# Patient Record
Sex: Male | Born: 1950
Health system: Southern US, Community
[De-identification: ages and names within clinical notes are randomized; demographics above are authoritative.]

## PROBLEM LIST (undated history)

## (undated) DIAGNOSIS — M419 Scoliosis, unspecified: Secondary | ICD-10-CM

## (undated) DIAGNOSIS — T7840XA Allergy, unspecified, initial encounter: Secondary | ICD-10-CM

## (undated) DIAGNOSIS — C801 Malignant (primary) neoplasm, unspecified: Secondary | ICD-10-CM

## (undated) HISTORY — DX: Malignant (primary) neoplasm, unspecified: C80.1

## (undated) HISTORY — DX: Allergy, unspecified, initial encounter: T78.40XA

## (undated) HISTORY — DX: Scoliosis, unspecified: M41.9

---

## 2001-07-30 ENCOUNTER — Encounter: Admission: RE | Admit: 2001-07-30 | Discharge: 2001-07-30 | Payer: Self-pay | Admitting: Family Medicine

## 2001-07-30 ENCOUNTER — Encounter: Payer: Self-pay | Admitting: Family Medicine

## 2002-08-13 HISTORY — PX: MOHS SURGERY: SUR867

## 2004-08-15 ENCOUNTER — Ambulatory Visit: Payer: Self-pay | Admitting: Family Medicine

## 2004-08-25 ENCOUNTER — Ambulatory Visit: Payer: Self-pay | Admitting: Family Medicine

## 2004-09-18 ENCOUNTER — Ambulatory Visit: Payer: Self-pay | Admitting: Family Medicine

## 2004-09-29 ENCOUNTER — Ambulatory Visit: Payer: Self-pay | Admitting: Internal Medicine

## 2004-10-11 ENCOUNTER — Ambulatory Visit: Payer: Self-pay | Admitting: Internal Medicine

## 2005-05-17 ENCOUNTER — Ambulatory Visit: Payer: Self-pay | Admitting: Family Medicine

## 2007-01-13 ENCOUNTER — Ambulatory Visit: Payer: Self-pay | Admitting: Family Medicine

## 2007-03-17 ENCOUNTER — Ambulatory Visit: Payer: Self-pay | Admitting: Family Medicine

## 2007-03-17 LAB — CONVERTED CEMR LAB
ALT: 17 units/L (ref 0–53)
AST: 21 units/L (ref 0–37)
Albumin: 3.7 g/dL (ref 3.5–5.2)
Alkaline Phosphatase: 34 units/L — ABNORMAL LOW (ref 39–117)
BUN: 16 mg/dL (ref 6–23)
Basophils Absolute: 0 10*3/uL (ref 0.0–0.1)
Basophils Relative: 0.5 % (ref 0.0–1.0)
Bilirubin Urine: NEGATIVE
Bilirubin, Direct: 0.1 mg/dL (ref 0.0–0.3)
Blood in Urine, dipstick: NEGATIVE
CO2: 30 meq/L (ref 19–32)
Calcium: 8.6 mg/dL (ref 8.4–10.5)
Chloride: 110 meq/L (ref 96–112)
Cholesterol: 178 mg/dL (ref 0–200)
Creatinine, Ser: 1.1 mg/dL (ref 0.4–1.5)
Eosinophils Absolute: 0.2 10*3/uL (ref 0.0–0.6)
Eosinophils Relative: 3.5 % (ref 0.0–5.0)
GFR calc Af Amer: 89 mL/min
GFR calc non Af Amer: 74 mL/min
Glucose, Bld: 98 mg/dL (ref 70–99)
Glucose, Urine, Semiquant: NEGATIVE
HCT: 42.4 % (ref 39.0–52.0)
HDL: 59 mg/dL (ref >39.0)
Hemoglobin: 14.4 g/dL (ref 13.0–17.0)
Ketones, urine, test strip: NEGATIVE
LDL Cholesterol: 107 mg/dL — ABNORMAL HIGH (ref 0–99)
Lymphocytes Relative: 25.1 % (ref 12.0–46.0)
MCHC: 34.1 g/dL (ref 30.0–36.0)
MCV: 92.8 fL (ref 78.0–100.0)
Monocytes Absolute: 0.8 10*3/uL — ABNORMAL HIGH (ref 0.2–0.7)
Monocytes Relative: 14.4 % — ABNORMAL HIGH (ref 3.0–11.0)
Neutro Abs: 3.1 10*3/uL (ref 1.4–7.7)
Neutrophils Relative %: 56.5 % (ref 43.0–77.0)
Nitrite: NEGATIVE
PSA: 0.73 ng/mL (ref 0.10–4.00)
Platelets: 197 10*3/uL (ref 150–400)
Potassium: 4.8 meq/L (ref 3.5–5.1)
Protein, U semiquant: NEGATIVE
RBC: 4.56 M/uL (ref 4.22–5.81)
RDW: 11.9 % (ref 11.5–14.6)
Sodium: 144 meq/L (ref 135–145)
Specific Gravity, Urine: 1.025
TSH: 0.92 microintl units/mL (ref 0.35–5.50)
Total Bilirubin: 1.2 mg/dL (ref 0.3–1.2)
Total CHOL/HDL Ratio: 3
Total Protein: 6 g/dL (ref 6.0–8.3)
Triglycerides: 59 mg/dL (ref 0–149)
Urobilinogen, UA: 0.2
VLDL: 12 mg/dL (ref 0–40)
WBC Urine, dipstick: NEGATIVE
WBC: 5.5 10*3/uL (ref 4.5–10.5)
pH: 6.5

## 2007-03-24 ENCOUNTER — Ambulatory Visit: Payer: Self-pay | Admitting: Family Medicine

## 2007-03-25 ENCOUNTER — Encounter: Payer: Self-pay | Admitting: Family Medicine

## 2007-08-25 ENCOUNTER — Ambulatory Visit: Payer: Self-pay | Admitting: Family Medicine

## 2007-08-25 DIAGNOSIS — J069 Acute upper respiratory infection, unspecified: Secondary | ICD-10-CM | POA: Insufficient documentation

## 2008-04-16 ENCOUNTER — Ambulatory Visit: Payer: Self-pay | Admitting: Family Medicine

## 2008-04-16 DIAGNOSIS — J309 Allergic rhinitis, unspecified: Secondary | ICD-10-CM | POA: Insufficient documentation

## 2009-07-05 ENCOUNTER — Ambulatory Visit: Payer: Self-pay | Admitting: Family Medicine

## 2009-07-05 LAB — CONVERTED CEMR LAB
ALT: 22 units/L (ref 0–53)
AST: 25 units/L (ref 0–37)
Albumin: 4.1 g/dL (ref 3.5–5.2)
Alkaline Phosphatase: 33 units/L — ABNORMAL LOW (ref 39–117)
BUN: 11 mg/dL (ref 6–23)
Basophils Absolute: 0 10*3/uL (ref 0.0–0.1)
Basophils Relative: 0.6 % (ref 0.0–3.0)
Bilirubin Urine: NEGATIVE
Bilirubin, Direct: 0.1 mg/dL (ref 0.0–0.3)
Blood in Urine, dipstick: NEGATIVE
CO2: 30 meq/L (ref 19–32)
Calcium: 9.2 mg/dL (ref 8.4–10.5)
Chloride: 106 meq/L (ref 96–112)
Cholesterol: 189 mg/dL (ref 0–200)
Creatinine, Ser: 1.2 mg/dL (ref 0.4–1.5)
Eosinophils Absolute: 0.2 10*3/uL (ref 0.0–0.7)
Eosinophils Relative: 3.5 % (ref 0.0–5.0)
GFR calc non Af Amer: 66.05 mL/min (ref >60)
Glucose, Bld: 94 mg/dL (ref 70–99)
Glucose, Urine, Semiquant: NEGATIVE
HCT: 43.9 % (ref 39.0–52.0)
HDL: 60.3 mg/dL (ref >39.00)
Hemoglobin: 15.1 g/dL (ref 13.0–17.0)
Ketones, urine, test strip: NEGATIVE
LDL Cholesterol: 117 mg/dL — ABNORMAL HIGH (ref 0–99)
Lymphocytes Relative: 25.4 % (ref 12.0–46.0)
Lymphs Abs: 1.5 10*3/uL (ref 0.7–4.0)
MCHC: 34.3 g/dL (ref 30.0–36.0)
MCV: 93.9 fL (ref 78.0–100.0)
Monocytes Absolute: 0.7 10*3/uL (ref 0.1–1.0)
Monocytes Relative: 12.6 % — ABNORMAL HIGH (ref 3.0–12.0)
Neutro Abs: 3.4 10*3/uL (ref 1.4–7.7)
Neutrophils Relative %: 57.9 % (ref 43.0–77.0)
Nitrite: NEGATIVE
PSA: 0.71 ng/mL (ref 0.10–4.00)
Platelets: 179 10*3/uL (ref 150.0–400.0)
Potassium: 4.8 meq/L (ref 3.5–5.1)
Protein, U semiquant: NEGATIVE
RBC: 4.68 M/uL (ref 4.22–5.81)
RDW: 12 % (ref 11.5–14.6)
Sodium: 142 meq/L (ref 135–145)
Specific Gravity, Urine: 1.02
TSH: 0.76 microintl units/mL (ref 0.35–5.50)
Total Bilirubin: 1.3 mg/dL — ABNORMAL HIGH (ref 0.3–1.2)
Total CHOL/HDL Ratio: 3
Total Protein: 6.6 g/dL (ref 6.0–8.3)
Triglycerides: 59 mg/dL (ref 0.0–149.0)
Urobilinogen, UA: 0.2
VLDL: 11.8 mg/dL (ref 0.0–40.0)
WBC Urine, dipstick: NEGATIVE
WBC: 5.8 10*3/uL (ref 4.5–10.5)
pH: 7

## 2009-07-14 ENCOUNTER — Ambulatory Visit: Payer: Self-pay | Admitting: Family Medicine

## 2009-09-22 ENCOUNTER — Ambulatory Visit: Payer: Self-pay | Admitting: Family Medicine

## 2010-05-09 DIAGNOSIS — H7290 Unspecified perforation of tympanic membrane, unspecified ear: Secondary | ICD-10-CM | POA: Insufficient documentation

## 2010-05-18 ENCOUNTER — Ambulatory Visit: Payer: Self-pay | Admitting: Family Medicine

## 2010-06-22 ENCOUNTER — Ambulatory Visit: Payer: Self-pay | Admitting: Family Medicine

## 2010-09-12 NOTE — Assessment & Plan Note (Signed)
Summary: COUGH/CHEST CONGESTION/CJR   Vital Signs:  Patient profile:   60 year old male Weight:      184 pounds Temp:     98.6 degrees F oral BP sitting:   130 / 98  (left arm) Cuff size:   regular  Vitals Entered By: Kern Reap CMA Duncan Dull) (September 22, 2009 12:45 PM)  Reason for Visit cough, congestion, sore throat  History of Present Illness: Kenneth York is a 60 year old, married man nonsmoker, who comes in today with a 11 day history of congestion, sore throat, and cough.  He said the fever, earache, sputum production, wheezing etc.  12 point review of systems negative  Allergies: No Known Drug Allergies  Past History:  Past medical, surgical, family and social histories (including risk factors) reviewed for relevance to current acute and chronic problems.  Past Medical History: Reviewed history from 04/16/2008 and no changes required. Allergic rhinitis  Family History: Reviewed history and no changes required.  Social History: Reviewed history from 04/16/2008 and no changes required. Occupation: Married Never Smoked Alcohol use-no Drug use-no Regular exercise-yes  Review of Systems      See HPI  Physical Exam  General:  Well-developed,well-nourished,in no acute distress; alert,appropriate and cooperative throughout examination Head:  Normocephalic and atraumatic without obvious abnormalities. No apparent alopecia or balding. Eyes:  No corneal or conjunctival inflammation noted. EOMI. Perrla. Funduscopic exam benign, without hemorrhages, exudates or papilledema. Vision grossly normal. Ears:  External ear exam shows no significant lesions or deformities.  Otoscopic examination reveals clear canals, tympanic membranes are intact bilaterally without bulging, retraction, inflammation or discharge. Hearing is grossly normal bilaterally. Nose:  External nasal examination shows no deformity or inflammation. Nasal mucosa are pink and moist without lesions or  exudates. Mouth:  Oral mucosa and oropharynx without lesions or exudates.  Teeth in good repair. Neck:  No deformities, masses, or tenderness noted. Chest Wall:  No deformities, masses, tenderness or gynecomastia noted. Lungs:  Normal respiratory effort, chest expands symmetrically. Lungs are clear to auscultation, no crackles or wheezes.   Problems:  Medical Problems Added: 1)  Dx of Viral Infection-unspec  (ICD-079.99)  Impression & Recommendations:  Problem # 1:  VIRAL INFECTION-UNSPEC (ICD-079.99) Assessment New  His updated medication list for this problem includes:    Hydromet 5-1.5 Mg/35ml Syrp (Hydrocodone-homatropine) .Marland Kitchen... 1 or 2 tsps at bedtime as needed  Complete Medication List: 1)  Flonase 50 Mcg/act Susp (Fluticasone propionate) .... Uad 2)  Hydromet 5-1.5 Mg/31ml Syrp (Hydrocodone-homatropine) .Marland Kitchen.. 1 or 2 tsps at bedtime as needed  Patient Instructions: 1)  drank 30 ounces of water daily, vaporizer or humidifier in y  bedroom at night, one or 2 teaspoons of Hydromet nightly, p.r.n. cough, cold, return p.r.n. Prescriptions: HYDROMET 5-1.5 MG/5ML SYRP (HYDROCODONE-HOMATROPINE) 1 or 2 tsps at bedtime as needed  #8oz x 1   Entered and Authorized by:   Roderick Pee MD   Signed by:   Roderick Pee MD on 09/22/2009   Method used:   Print then Give to Patient   RxID:   [id number]

## 2010-09-12 NOTE — Assessment & Plan Note (Signed)
Summary: F/U R EAR DISCOMFORT / PAIN // RS   Vital Signs:  Patient profile:   60 year old male Height:      70.75 inches Weight:      183 pounds BMI:     25.80 Temp:     98.2 degrees F oral BP sitting:   120 / 84  (left arm) Cuff size:   regular  Vitals Entered By: Kern Reap CMA Duncan Dull) (May 18, 2010 12:53 PM) CC: follow-up visit for ear infection   CC:  follow-up visit for ear infection.  History of Present Illness: Kenneth York is a 60 year old, married male, nonsmoker, who comes in today for evaluation of the sudden onset of pain and bleeding in his right ear canal about 12 days ago.  Past 12 days ago.  He was the beach.  It was a Tuesday night September, the 27th, and he woke up at two o'clock in the morning with severe pain in his right ear.  He did notice fluid and blood draining from his ear.  He went to a local urgent care and was told he has tremors.  There and was given Cipro otic drops.  He finished the drops and was told to get follow-up.  He states he is pain-free and has no vertigo no hearing loss.  These never had a problem like this before  Allergies: No Known Drug Allergies  Past History:  Past medical, surgical, family and social histories (including risk factors) reviewed for relevance to current acute and chronic problems.  Past Medical History: Reviewed history from 04/16/2008 and no changes required. Allergic rhinitis  Family History: Reviewed history and no changes required.  Social History: Reviewed history from 04/16/2008 and no changes required. Occupation: Married Never Smoked Alcohol use-no Drug use-no Regular exercise-yes  Review of Systems      See HPI       Flu Vaccine Consent Questions     Do you have a history of severe allergic reactions to this vaccine? no    Any prior history of allergic reactions to egg and/or gelatin? no    Do you have a sensitivity to the preservative Thimersol? no    Do you have a past history of  Guillan-Barre Syndrome? no    Do you currently have an acute febrile illness? no    Have you ever had a severe reaction to latex? no    Vaccine information given and explained to patient? yes    Are you currently pregnant? no    Lot Number:[id number]   Exp Date:02/10/2011   Site Given  Right  Deltoid IM   Physical Exam  General:  Well-developed,well-nourished,in no acute distress; alert,appropriate and cooperative throughout examination Ears:  left ear canal, and left TM normal right ear canal.  Normal and there is a blood clot, perforation.   Impression & Recommendations:  Problem # 1:  TYMPANIC MEMBRANE PERFORATION, RIGHT EAR (ICD-384.20) Assessment New  Complete Medication List: 1)  Flonase 50 Mcg/act Susp (Fluticasone propionate) .... Uad 2)  Hydromet 5-1.5 Mg/54ml Syrp (Hydrocodone-homatropine) .Marland Kitchen.. 1 or 2 tsps at bedtime as needed  Other Orders: Admin 1st Vaccine (56213) Flu Vaccine 60yrs + (08657)  Patient Instructions: 1)  stop the eardrops return in 6 weeks for follow-up, sooner if any problems

## 2010-09-12 NOTE — Assessment & Plan Note (Signed)
Summary: roa/fup/rcd   Vital Signs:  Patient profile:   60 year old male Weight:      184 pounds Temp:     98.6 degrees F oral BP sitting:   130 / 90  (left arm) Cuff size:   regular  Vitals Entered By: Kern Reap CMA Duncan Dull) (June 22, 2010 11:42 AM) CC: follow-up visit   CC:  follow-up visit.  History of Present Illness: Kenneth York is a 60 year old male, who comes in today for follow-up of a ruptured right tympanic membrane.  He feels well.  No pain no dizziness.  Normal.  Hearing  Allergies: No Known Drug Allergies  Physical Exam  General:  Well-developed,well-nourished,in no acute distress; alert,appropriate and cooperative throughout examination Ears:  the right ear.  It was a normal.  There is a healed tympanic membrane perforation.   Impression & Recommendations:  Problem # 1:  TYMPANIC MEMBRANE PERFORATION, RIGHT EAR (ICD-384.20) Assessment Improved  Complete Medication List: 1)  Flonase 50 Mcg/act Susp (Fluticasone propionate) .... Uad 2)  Hydromet 5-1.5 Mg/30ml Syrp (Hydrocodone-homatropine) .Marland Kitchen.. 1 or 2 tsps at bedtime as needed  Patient Instructions: 1)  Please schedule a follow-up appointment as needed.   Orders Added: 1)  Est. Patient Level III [04540]

## 2011-04-09 ENCOUNTER — Ambulatory Visit (INDEPENDENT_AMBULATORY_CARE_PROVIDER_SITE_OTHER): Payer: BC Managed Care – PPO | Admitting: Family Medicine

## 2011-04-09 ENCOUNTER — Encounter: Payer: Self-pay | Admitting: Family Medicine

## 2011-04-09 VITALS — BP 108/70 | Temp 98.5°F | Ht 72.0 in | Wt 181.0 lb

## 2011-04-09 DIAGNOSIS — R351 Nocturia: Secondary | ICD-10-CM

## 2011-04-09 DIAGNOSIS — J309 Allergic rhinitis, unspecified: Secondary | ICD-10-CM

## 2011-04-09 DIAGNOSIS — R300 Dysuria: Secondary | ICD-10-CM

## 2011-04-09 LAB — POCT URINALYSIS DIPSTICK
Bilirubin, UA: NEGATIVE
Blood, UA: NEGATIVE
Glucose, UA: NEGATIVE
Ketones, UA: NEGATIVE
Leukocytes, UA: NEGATIVE
Nitrite, UA: NEGATIVE
Protein, UA: NEGATIVE
Spec Grav, UA: 1.005
Urobilinogen, UA: 0.2
pH, UA: 7.5

## 2011-04-09 NOTE — Patient Instructions (Signed)
Drink lots of water, and the nocturia should resolve on its own.  Avoid caffeine.  Add 10 mg of plain Zyrtec to the steroid nasal spray at bedtime

## 2011-04-09 NOTE — Progress Notes (Signed)
  Subjective:    Patient ID: Kenneth York, male    DOB: Sep 08, 1950, 60 y.o.   MRN: [id number]  HPI Kenneth York is a 60 year old, married male, nonsmoker, who comes in today for evaluation of two problems.  About 3 weeks ago he began having nocturia x 3.  No symptoms otherwise, infection.  No fever, chills, discharge, et Karie Soda.  He's not had prostatitis in the past.  He feels like his symptoms are improving, but not gone.  He does not drink a lot of caffeine.  He's also having symptoms of allergic rhinitis with head congestion, postnasal drip, and slight cough.  No wheezing.   Review of Systems General in pulmonary and urologic review of systems otherwise negative    Objective:   Physical Exam  Well-developed well-nourished man in no acute distress.  Urinalysis normal      Assessment & Plan:  Nocturia plan reassured drink lots of water.  Return p.r.n.  Allergic rhinitis restart the steroid nasal spray and 10 mg of Zyrtec plane nightly

## 2011-07-26 ENCOUNTER — Ambulatory Visit (INDEPENDENT_AMBULATORY_CARE_PROVIDER_SITE_OTHER): Payer: BC Managed Care – PPO | Admitting: Family Medicine

## 2011-07-26 ENCOUNTER — Telehealth: Payer: Self-pay

## 2011-07-26 DIAGNOSIS — Z2911 Encounter for prophylactic immunotherapy for respiratory syncytial virus (RSV): Secondary | ICD-10-CM

## 2011-07-26 DIAGNOSIS — Z23 Encounter for immunization: Secondary | ICD-10-CM

## 2011-07-26 NOTE — Telephone Encounter (Signed)
Per Dr Tawanna Cooler.  Hydromet 4 oz . Left message on machine for patient

## 2011-07-26 NOTE — Telephone Encounter (Signed)
Pt states he may be getting a sinus infection.  Pt has been usuing saline nasal spray as well as Nasonex.  When pt blows his nose he gets some bleed. Pt denies fever, body aches, chills.  Pt would like a z-pak call in to pharmacy.  Pls advise.

## 2011-11-23 ENCOUNTER — Other Ambulatory Visit (INDEPENDENT_AMBULATORY_CARE_PROVIDER_SITE_OTHER): Payer: BC Managed Care – PPO

## 2011-11-23 DIAGNOSIS — Z0000 Encounter for general adult medical examination without abnormal findings: Secondary | ICD-10-CM

## 2011-11-23 LAB — PSA: PSA: 0.85 ng/mL (ref 0.10–4.00)

## 2011-11-23 LAB — HEPATIC FUNCTION PANEL
AST: 22 U/L (ref 0–37)
Albumin: 4.2 g/dL (ref 3.5–5.2)
Alkaline Phosphatase: 42 U/L (ref 39–117)
Total Protein: 6.7 g/dL (ref 6.0–8.3)

## 2011-11-23 LAB — CBC WITH DIFFERENTIAL/PLATELET
Basophils Absolute: 0 10*3/uL (ref 0.0–0.1)
Basophils Relative: 0.5 % (ref 0.0–3.0)
HCT: 43.7 % (ref 39.0–52.0)
Hemoglobin: 14.8 g/dL (ref 13.0–17.0)
Lymphocytes Relative: 20.7 % (ref 12.0–46.0)
Lymphs Abs: 1.3 10*3/uL (ref 0.7–4.0)
MCHC: 33.8 g/dL (ref 30.0–36.0)
Monocytes Relative: 12 % (ref 3.0–12.0)
Neutro Abs: 4.1 10*3/uL (ref 1.4–7.7)
RBC: 4.7 Mil/uL (ref 4.22–5.81)
RDW: 12.7 % (ref 11.5–14.6)

## 2011-11-23 LAB — POCT URINALYSIS DIPSTICK
Bilirubin, UA: NEGATIVE
Glucose, UA: NEGATIVE
Ketones, UA: NEGATIVE
Leukocytes, UA: NEGATIVE
Nitrite, UA: NEGATIVE

## 2011-11-23 LAB — BASIC METABOLIC PANEL
CO2: 28 mEq/L (ref 19–32)
Calcium: 9.3 mg/dL (ref 8.4–10.5)
Glucose, Bld: 85 mg/dL (ref 70–99)
Potassium: 4.4 mEq/L (ref 3.5–5.1)
Sodium: 140 mEq/L (ref 135–145)

## 2011-11-23 LAB — TSH: TSH: 0.9 u[IU]/mL (ref 0.35–5.50)

## 2011-11-23 LAB — LIPID PANEL: Total CHOL/HDL Ratio: 3

## 2011-11-29 ENCOUNTER — Other Ambulatory Visit: Payer: BC Managed Care – PPO

## 2011-12-06 ENCOUNTER — Encounter: Payer: BC Managed Care – PPO | Admitting: Family Medicine

## 2011-12-20 ENCOUNTER — Ambulatory Visit (INDEPENDENT_AMBULATORY_CARE_PROVIDER_SITE_OTHER): Payer: BC Managed Care – PPO | Admitting: Family Medicine

## 2011-12-20 ENCOUNTER — Encounter: Payer: Self-pay | Admitting: Family Medicine

## 2011-12-20 DIAGNOSIS — M419 Scoliosis, unspecified: Secondary | ICD-10-CM | POA: Insufficient documentation

## 2011-12-20 DIAGNOSIS — Z0000 Encounter for general adult medical examination without abnormal findings: Secondary | ICD-10-CM

## 2011-12-20 DIAGNOSIS — J309 Allergic rhinitis, unspecified: Secondary | ICD-10-CM

## 2011-12-20 DIAGNOSIS — M4120 Other idiopathic scoliosis, site unspecified: Secondary | ICD-10-CM

## 2011-12-20 MED ORDER — FLUTICASONE PROPIONATE 50 MCG/ACT NA SUSP: 2.0000 | Freq: Every day | NASAL | Status: DC

## 2011-12-20 NOTE — Progress Notes (Signed)
  Subjective:    Patient ID: Kenneth York, male    DOB: 01-19-1951, 61 y.o.   MRN: [id number]  HPI Kenneth York is a 61 year old married male nonsmoker who comes in today for general physical examination  He's always been in excellent health he's had no chronic health problems except for some minor allergic rhinitis. He does have some congenital thoracic scoliosis  He gets routine eye care, dental care, initial colonoscopy 8 years ago normal, tetanus 2010, shingles 2012, he exercises on a regular basis he is very fit. He does have some tenderness in his left elbow from lifting weights   Review of Systems  Constitutional: Negative.   HENT: Negative.   Eyes: Negative.   Respiratory: Negative.   Cardiovascular: Negative.   Gastrointestinal: Negative.   Genitourinary: Negative.   Musculoskeletal: Negative.   Skin: Negative.   Neurological: Negative.   Hematological: Negative.   Psychiatric/Behavioral: Negative.        Objective:   Physical Exam  Constitutional: He is oriented to person, place, and time. He appears well-developed and well-nourished.  HENT:  Head: Normocephalic and atraumatic.  Right Ear: External ear normal.  Left Ear: External ear normal.  Nose: Nose normal.  Mouth/Throat: Oropharynx is clear and moist.  Eyes: Conjunctivae and EOM are normal. Pupils are equal, round, and reactive to light.  Neck: Normal range of motion. Neck supple. No JVD present. No tracheal deviation present. No thyromegaly present.  Cardiovascular: Normal rate, regular rhythm, normal heart sounds and intact distal pulses.  Exam reveals no gallop and no friction rub.   No murmur heard.      Thoracic scoliosis  Pulmonary/Chest: Effort normal and breath sounds normal. No stridor. No respiratory distress. He has no wheezes. He has no rales. He exhibits no tenderness.  Abdominal: Soft. Bowel sounds are normal. He exhibits no distension and no mass. There is no tenderness. There is no rebound and  no guarding.  Genitourinary: Rectum normal, prostate normal and penis normal. Guaiac negative stool. No penile tenderness.  Musculoskeletal: Normal range of motion. He exhibits no edema and no tenderness.  Lymphadenopathy:    He has no cervical adenopathy.  Neurological: He is alert and oriented to person, place, and time. He has normal reflexes. No cranial nerve deficit. He exhibits normal muscle tone.  Skin: Skin is warm and dry. No rash noted. No erythema. No pallor.  Psychiatric: He has a normal mood and affect. His behavior is normal. Judgment and thought content normal.          Assessment & Plan:  Healthy male  Allergic rhinitis continue over-the-counter Claritin steroid nasal spray  Thoracic scoliosis asymptomatic  EKG shows some nodal beats asymptomatic patient says he does have an occasional fluttering sensation but short lived and rare and not exercise related reassured

## 2011-12-20 NOTE — Patient Instructions (Signed)
Continue your good health habits return in one year sooner if any problems 

## 2012-10-09 ENCOUNTER — Other Ambulatory Visit (INDEPENDENT_AMBULATORY_CARE_PROVIDER_SITE_OTHER): Payer: BC Managed Care – PPO

## 2012-10-09 DIAGNOSIS — Z0000 Encounter for general adult medical examination without abnormal findings: Secondary | ICD-10-CM

## 2012-10-09 LAB — TSH: TSH: 0.92 u[IU]/mL (ref 0.35–5.50)

## 2012-10-09 LAB — CBC WITH DIFFERENTIAL/PLATELET
Basophils Relative: 0.5 % (ref 0.0–3.0)
Eosinophils Absolute: 0.1 10*3/uL (ref 0.0–0.7)
Eosinophils Relative: 2.4 % (ref 0.0–5.0)
Hemoglobin: 15.2 g/dL (ref 13.0–17.0)
Lymphocytes Relative: 22.1 % (ref 12.0–46.0)
MCHC: 33.8 g/dL (ref 30.0–36.0)
Monocytes Relative: 14.1 % — ABNORMAL HIGH (ref 3.0–12.0)
Neutro Abs: 3.1 10*3/uL (ref 1.4–7.7)
Neutrophils Relative %: 60.9 % (ref 43.0–77.0)
RBC: 4.94 Mil/uL (ref 4.22–5.81)
WBC: 5.1 10*3/uL (ref 4.5–10.5)

## 2012-10-09 LAB — HEPATIC FUNCTION PANEL
AST: 23 U/L (ref 0–37)
Alkaline Phosphatase: 37 U/L — ABNORMAL LOW (ref 39–117)
Bilirubin, Direct: 0.1 mg/dL (ref 0.0–0.3)
Total Bilirubin: 0.9 mg/dL (ref 0.3–1.2)

## 2012-10-09 LAB — POCT URINALYSIS DIPSTICK
Bilirubin, UA: NEGATIVE
Blood, UA: NEGATIVE
Glucose, UA: NEGATIVE
Ketones, UA: NEGATIVE
Nitrite, UA: NEGATIVE
Spec Grav, UA: 1.01

## 2012-10-09 LAB — BASIC METABOLIC PANEL
BUN: 15 mg/dL (ref 6–23)
CO2: 27 mEq/L (ref 19–32)
Chloride: 104 mEq/L (ref 96–112)
Potassium: 5.1 mEq/L (ref 3.5–5.1)

## 2012-10-09 LAB — LIPID PANEL
Cholesterol: 191 mg/dL (ref 0–200)
Triglycerides: 49 mg/dL (ref 0.0–149.0)

## 2012-10-16 ENCOUNTER — Encounter: Payer: BC Managed Care – PPO | Admitting: Family Medicine

## 2012-11-18 ENCOUNTER — Encounter: Payer: BC Managed Care – PPO | Admitting: Family Medicine

## 2012-12-11 ENCOUNTER — Ambulatory Visit (INDEPENDENT_AMBULATORY_CARE_PROVIDER_SITE_OTHER): Payer: BC Managed Care – PPO | Admitting: Family Medicine

## 2012-12-11 ENCOUNTER — Encounter: Payer: Self-pay | Admitting: Family Medicine

## 2012-12-11 VITALS — BP 130/90 | Temp 98.7°F | Wt 178.0 lb

## 2012-12-11 DIAGNOSIS — J309 Allergic rhinitis, unspecified: Secondary | ICD-10-CM

## 2012-12-11 MED ORDER — FLUTICASONE PROPIONATE 50 MCG/ACT NA SUSP: 2.0000 | Freq: Every day | NASAL | Status: DC

## 2012-12-11 NOTE — Progress Notes (Signed)
  Subjective:    Patient ID: BENTON TOOKER, male    DOB: Aug 16, 1950, 62 y.o.   MRN: [id number]  HPI Mr. Korber is a 62 year old male married nonsmoker who comes in today for evaluation of a cough sore throat and congestion for a couple weeks  He has a history of allergic rhinitis however he's not been using any nasal spray no antihistamines. His symptoms started April 14. No fever no facial pain   Review of Systems    review of systems negative Objective:   Physical Exam  Well-developed and nourished male no acute distress HEENT were negative neck was supple no adenopathy lungs are clear he does have thoracic scoliosis with elevation and deformity of his right posterior ribs      Assessment & Plan:  Allergic rhinitis plan treat symptomatically with Zyrtec and steroid nasal spray

## 2012-12-11 NOTE — Patient Instructions (Signed)
Zyrtec 10 mg plain,,,,,,,,,, 1 at bedtime  Steroid nasal spray,,,,,,,,,, 1 shot up each nostril at bedtime

## 2012-12-23 ENCOUNTER — Encounter: Payer: Self-pay | Admitting: Family Medicine

## 2012-12-23 ENCOUNTER — Ambulatory Visit (INDEPENDENT_AMBULATORY_CARE_PROVIDER_SITE_OTHER): Payer: BC Managed Care – PPO | Admitting: Family Medicine

## 2012-12-23 VITALS — BP 110/76 | Temp 98.4°F | Ht 71.25 in | Wt 178.0 lb

## 2012-12-23 DIAGNOSIS — J309 Allergic rhinitis, unspecified: Secondary | ICD-10-CM

## 2012-12-23 DIAGNOSIS — Z0000 Encounter for general adult medical examination without abnormal findings: Secondary | ICD-10-CM

## 2012-12-23 NOTE — Progress Notes (Signed)
  Subjective:    Patient ID: Kenneth York, male    DOB: 1950/12/11, 62 y.o.   MRN: [id number]  HPI Kenneth York is a 62 year old married male nonsmoker who comes in today for general physical examination  He has a history of allergic rhinitis for which he uses OTC Zyrtec and a steroid nasal spray  He gets routine eye care, dental care, colonoscopy in his early 55s normal, tetanus 2010, seasonal flu shot every fall, shingles 2012   Review of Systems  Constitutional: Negative.   HENT: Negative.   Eyes: Negative.   Respiratory: Negative.   Cardiovascular: Negative.   Gastrointestinal: Negative.   Genitourinary: Negative.   Musculoskeletal: Negative.   Skin: Negative.   Neurological: Negative.   Psychiatric/Behavioral: Negative.        Objective:   Physical Exam  Constitutional: He is oriented to person, place, and time. He appears well-developed and well-nourished.  HENT:  Head: Normocephalic and atraumatic.  Right Ear: External ear normal.  Left Ear: External ear normal.  Nose: Nose normal.  Mouth/Throat: Oropharynx is clear and moist.  Eyes: Conjunctivae and EOM are normal. Pupils are equal, round, and reactive to light.  Neck: Normal range of motion. Neck supple. No JVD present. No tracheal deviation present. No thyromegaly present.  Cardiovascular: Normal rate, regular rhythm, normal heart sounds and intact distal pulses.  Exam reveals no gallop and no friction rub.   No murmur heard. Carotids aorta and pulses all normal no bruits  Pulmonary/Chest: Effort normal and breath sounds normal. No stridor. No respiratory distress. He has no wheezes. He has no rales. He exhibits no tenderness.  Thoracic scoliosis  Abdominal: Soft. Bowel sounds are normal. He exhibits no distension and no mass. There is no tenderness. There is no rebound and no guarding.  Genitourinary: Rectum normal, prostate normal and penis normal. Guaiac negative stool. No penile tenderness.  Musculoskeletal:  Normal range of motion. He exhibits no edema and no tenderness.  Lymphadenopathy:    He has no cervical adenopathy.  Neurological: He is alert and oriented to person, place, and time. He has normal reflexes. No cranial nerve deficit. He exhibits normal muscle tone.  Skin: Skin is warm and dry. No rash noted. No erythema. No pallor.  Psychiatric: He has a normal mood and affect. His behavior is normal. Judgment and thought content normal.          Assessment & Plan:  Healthy male  Allergic rhinitis continue Zyrtec and steroid nasal spray

## 2012-12-23 NOTE — Patient Instructions (Signed)
Continue the plain 10 mg of Zyrtec along with a steroid nasal spray  Return in one year for general physical examination sooner if any problems

## 2014-08-19 ENCOUNTER — Encounter: Payer: Self-pay | Admitting: Internal Medicine

## 2014-10-04 ENCOUNTER — Encounter: Payer: Self-pay | Admitting: Gastroenterology

## 2014-10-22 ENCOUNTER — Encounter: Payer: Self-pay | Admitting: Gastroenterology

## 2014-12-17 ENCOUNTER — Encounter: Payer: Self-pay | Admitting: Internal Medicine

## 2014-12-23 ENCOUNTER — Encounter: Payer: Self-pay | Admitting: Gastroenterology

## 2014-12-28 ENCOUNTER — Ambulatory Visit (INDEPENDENT_AMBULATORY_CARE_PROVIDER_SITE_OTHER): Payer: BC Managed Care – PPO | Admitting: Adult Health

## 2014-12-28 ENCOUNTER — Encounter: Payer: Self-pay | Admitting: Adult Health

## 2014-12-28 VITALS — BP 112/86 | HR 68 | Temp 98.6°F | Ht 71.25 in | Wt 177.2 lb

## 2014-12-28 DIAGNOSIS — R05 Cough: Secondary | ICD-10-CM

## 2014-12-28 DIAGNOSIS — J069 Acute upper respiratory infection, unspecified: Secondary | ICD-10-CM

## 2014-12-28 DIAGNOSIS — R059 Cough, unspecified: Secondary | ICD-10-CM

## 2014-12-28 MED ORDER — PREDNISONE 20 MG PO TABS: 20.0000 mg | ORAL_TABLET | Freq: Every day | ORAL | Status: DC

## 2014-12-28 MED ORDER — ALBUTEROL SULFATE 108 (90 BASE) MCG/ACT IN AEPB: 108.0000 ug | INHALATION_SPRAY | Freq: Three times a day (TID) | RESPIRATORY_TRACT | Status: DC | PRN

## 2014-12-28 NOTE — Progress Notes (Signed)
   Subjective:    Patient ID: Kenneth York, male    DOB: [birth year], 64 y.o.   MRN: [id number]  HPI Patient presents to the office to "make sure I don't have pneumonia or lung cancer." He endorses having a cough with occassional production for the last few months. He has tried Zyrtec, once a week. Has not tried anything else. He is going on a trip to [facility] and wants to make sure nothing is going on. He continues to cough and clear his throat.   Endorses that "nothing is stopping me from doing anything, I still work out and enjoy my life."  Denies fever, n/v/d/. No sick contact.   Review of Systems  HENT: Positive for congestion, ear pain, postnasal drip and rhinorrhea. Negative for hearing loss and sinus pressure.   Respiratory: Positive for cough. Negative for chest tightness, shortness of breath and wheezing.   Cardiovascular: Negative for chest pain and palpitations.  Hematological: Negative for adenopathy.  All other systems reviewed and are negative.      Objective:   Physical Exam  Constitutional: He is oriented to person, place, and time. He appears well-developed and well-nourished. No distress.  HENT:  Head: Normocephalic and atraumatic.  Right Ear: External ear normal.  Left Ear: External ear normal.  Nose: Nose normal.  Mouth/Throat: Oropharynx is clear and moist.  Cardiovascular: Normal rate, normal heart sounds and intact distal pulses.   Pulmonary/Chest: Effort normal. No respiratory distress. He exhibits no tenderness.  Rhonchi in right upper   Musculoskeletal: Normal range of motion.  Neurological: He is alert and oriented to person, place, and time.  Skin: Skin is warm and dry. Rash noted. He is not diaphoretic. No erythema. No pallor.  Psychiatric: He has a normal mood and affect. His behavior is normal. Thought content normal.  Nursing note and vitals reviewed.      Assessment & Plan:   1. Acute upper respiratory infection - Likely bronchitis. Due to  time frame, and rhonchi,I suggested ABX and  Chest xray. Patient refused at this time.  - Albuterol Sulfate (PROAIR RESPICLICK) 676 (90 BASE) MCG/ACT AEPB; Inhale 108 mcg into the lungs 3 (three) times daily as needed.  Dispense: 1 each; Refill: 2 - predniSONE (DELTASONE) 20 MG tablet; Take 1 tablet (20 mg total) by mouth daily with breakfast.  Dispense: 9 tablet; Refill: 0 - Follow up if fever develops, he has SOB, or increased sputum production.   2. Cough - predniSONE (DELTASONE) 20 MG tablet; Take 1 tablet (20 mg total) by mouth daily with breakfast.  Dispense: 9 tablet; Refill: 0

## 2014-12-28 NOTE — Patient Instructions (Addendum)
I have sent a prescription to the pharmacy for the inhaler. Use it three times a day as needed. I have also sent a prescription to the pharmacy for a prednisone taper. Please take as directed: Day 1 40 mg Day 2 40 mg Day 3 30 mg Day 4 30 mg Day 5 20 mg Day 6 10 mg Day 7 10 mg  Continue to use Zyrtec and Flonase on a daily basis. You can also incorporate Mucinex into your routine to help with the congestion.   Follow up if you are not feeling better in 2-3 day or when you return from [place]. Follow up sooner if you notice a fever, body aches, shortness of breath or increased sputum production. Upper Respiratory Infection, Adult An upper respiratory infection (URI) is also sometimes known as the common cold. The upper respiratory tract includes the nose, sinuses, throat, trachea, and bronchi. Bronchi are the airways leading to the lungs. Most people improve within 1 week, but symptoms can last up to 2 weeks. A residual cough may last even longer.  CAUSES Many different viruses can infect the tissues lining the upper respiratory tract. The tissues become irritated and inflamed and often become very moist. Mucus production is also common. A cold is contagious. You can easily spread the virus to others by oral contact. This includes kissing, sharing a glass, coughing, or sneezing. Touching your mouth or nose and then touching a surface, which is then touched by another person, can also spread the virus. SYMPTOMS  Symptoms typically develop 1 to 3 days after you come in contact with a cold virus. Symptoms vary from person to person. They may include:  Runny nose.  Sneezing.  Nasal congestion.  Sinus irritation.  Sore throat.  Loss of voice (laryngitis).  Cough.  Fatigue.  Muscle aches.  Loss of appetite.  Headache.  Low-grade fever. DIAGNOSIS  You might diagnose your own cold based on familiar symptoms, since most people get a cold 2 to 3 times a year. Your caregiver can confirm  this based on your exam. Most importantly, your caregiver can check that your symptoms are not due to another disease such as strep throat, sinusitis, pneumonia, asthma, or epiglottitis. Blood tests, throat tests, and X-rays are not necessary to diagnose a common cold, but they may sometimes be helpful in excluding other more serious diseases. Your caregiver will decide if any further tests are required. RISKS AND COMPLICATIONS  You may be at risk for a more severe case of the common cold if you smoke cigarettes, have chronic heart disease (such as heart failure) or lung disease (such as asthma), or if you have a weakened immune system. The very young and very old are also at risk for more serious infections. Bacterial sinusitis, middle ear infections, and bacterial pneumonia can complicate the common cold. The common cold can worsen asthma and chronic obstructive pulmonary disease (COPD). Sometimes, these complications can require emergency medical care and may be life-threatening. PREVENTION  The best way to protect against getting a cold is to practice good hygiene. Avoid oral or hand contact with people with cold symptoms. Wash your hands often if contact occurs. There is no clear evidence that vitamin C, vitamin E, echinacea, or exercise reduces the chance of developing a cold. However, it is always recommended to get plenty of rest and practice good nutrition. TREATMENT  Treatment is directed at relieving symptoms. There is no cure. Antibiotics are not effective, because the infection is caused by a  virus, not by bacteria. Treatment may include:  Increased fluid intake. Sports drinks offer valuable electrolytes, sugars, and fluids.  Breathing heated mist or steam (vaporizer or shower).  Eating chicken soup or other clear broths, and maintaining good nutrition.  Getting plenty of rest.  Using gargles or lozenges for comfort.  Controlling fevers with ibuprofen or acetaminophen as directed by  your caregiver.  Increasing usage of your inhaler if you have asthma. Zinc gel and zinc lozenges, taken in the first 24 hours of the common cold, can shorten the duration and lessen the severity of symptoms. Pain medicines may help with fever, muscle aches, and throat pain. A variety of non-prescription medicines are available to treat congestion and runny nose. Your caregiver can make recommendations and may suggest nasal or lung inhalers for other symptoms.  HOME CARE INSTRUCTIONS   Only take over-the-counter or prescription medicines for pain, discomfort, or fever as directed by your caregiver.  Use a warm mist humidifier or inhale steam from a shower to increase air moisture. This may keep secretions moist and make it easier to breathe.  Drink enough water and fluids to keep your urine clear or pale yellow.  Rest as needed.  Return to work when your temperature has returned to normal or as your caregiver advises. You may need to stay home longer to avoid infecting others. You can also use a face mask and careful hand washing to prevent spread of the virus. SEEK MEDICAL CARE IF:   After the first few days, you feel you are getting worse rather than better.  You need your caregiver's advice about medicines to control symptoms.  You develop chills, worsening shortness of breath, or brown or red sputum. These may be signs of pneumonia.  You develop yellow or brown nasal discharge or pain in the face, especially when you bend forward. These may be signs of sinusitis.  You develop a fever, swollen neck glands, pain with swallowing, or white areas in the back of your throat. These may be signs of strep throat. SEEK IMMEDIATE MEDICAL CARE IF:   You have a fever.  You develop severe or persistent headache, ear pain, sinus pain, or chest pain.  You develop wheezing, a prolonged cough, cough up blood, or have a change in your usual mucus (if you have chronic lung disease).  You develop  sore muscles or a stiff neck. Document Released: 01/23/2001 Document Revised: 10/22/2011 Document Reviewed: 11/04/2013 [facility] Patient Information 2015 [place], [place]. This information is not intended to replace advice given to you by your health care provider. Make sure you discuss any questions you have with your health care provider.

## 2014-12-28 NOTE — Progress Notes (Signed)
Pre visit review using our clinic review tool, if applicable. No additional management support is needed unless otherwise documented below in the visit note. 

## 2015-02-03 ENCOUNTER — Ambulatory Visit (AMBULATORY_SURGERY_CENTER): Payer: Self-pay

## 2015-02-03 VITALS — Ht 72.0 in | Wt 175.8 lb

## 2015-02-03 DIAGNOSIS — Z1211 Encounter for screening for malignant neoplasm of colon: Secondary | ICD-10-CM

## 2015-02-03 NOTE — Progress Notes (Signed)
No allergies to eggs or soy No past problems with anesthesia No diet/weight loss meds No home oxygen  Has email  Emmi instructions given for colonoscopy 

## 2015-02-18 ENCOUNTER — Encounter: Payer: Self-pay | Admitting: Internal Medicine

## 2015-02-18 ENCOUNTER — Ambulatory Visit (AMBULATORY_SURGERY_CENTER): Payer: BC Managed Care – PPO | Admitting: Internal Medicine

## 2015-02-18 VITALS — BP 108/78 | HR 62 | Temp 96.5°F | Resp 12 | Ht 72.0 in | Wt 175.0 lb

## 2015-02-18 DIAGNOSIS — Z1211 Encounter for screening for malignant neoplasm of colon: Secondary | ICD-10-CM

## 2015-02-18 DIAGNOSIS — D124 Benign neoplasm of descending colon: Secondary | ICD-10-CM

## 2015-02-18 MED ORDER — SODIUM CHLORIDE 0.9 % IV SOLN: 500.0000 mL | INTRAVENOUS | Status: DC

## 2015-02-18 NOTE — Op Note (Signed)
[facility]  [address] [place], [id number]   COLONOSCOPY PROCEDURE REPORT  PATIENT: Pace, Lamadrid  MR#: [id number] BIRTHDATE: 09-19-1950 , 63  yrs. old GENDER: male ENDOSCOPIST: Gatha Mayer, MD, [facility] PROCEDURE DATE:  02/18/2015 PROCEDURE:   Colonoscopy, screening and Colonoscopy with biopsy First Screening Colonoscopy - Avg.  risk and is 50 yrs.  old or older - No.  Prior Negative Screening - Now for repeat screening. N/A  History of Adenoma - Now for follow-up colonoscopy & has been > or = to 3 yrs.  N/A  Polyps removed today? Yes ASA CLASS:   Class I INDICATIONS:Screening for colonic neoplasia and Colorectal Neoplasm Risk Assessment for this procedure is average risk. MEDICATIONS: Propofol 200 mg IV and Monitored anesthesia care  DESCRIPTION OF PROCEDURE:   After the risks benefits and alternatives of the procedure were thoroughly explained, informed consent was obtained.  The digital rectal exam revealed no abnormalities of the rectum, revealed no prostatic nodules, and revealed the prostate was not enlarged.   The LB IP-[id number] endoscope was introduced through the anus and advanced to the cecum, which was identified by both the appendix and ileocecal valve. No adverse events experienced.   The quality of the prep was excellent.  (MiraLax was used)  The instrument was then slowly withdrawn as the colon was fully examined. Estimated blood loss is zero unless otherwise noted in this procedure report.      COLON FINDINGS: A sessile polyp measuring 2 mm in size was found in the descending colon.  A polypectomy was performed with cold forceps.  The resection was complete, the polyp tissue was completely retrieved and sent to histology.   The examination was otherwise normal.  Retroflexed views revealed no abnormalities. The time to cecum = 3.9 Withdrawal time = 10.7   The scope was withdrawn and the procedure completed. COMPLICATIONS: There  were no immediate complications.  ENDOSCOPIC IMPRESSION: 1.   Sessile polyp was found in the descending colon; polypectomy was performed with cold forceps 2.   The examination was otherwise normal - excellent prep - second screening exam  RECOMMENDATIONS: Timing of repeat colonoscopy will be determined by pathology findings.  eSigned:  Gatha Mayer, MD, [facility] 02/18/2015 8:28 AM   cc: The Patient

## 2015-02-18 NOTE — Patient Instructions (Addendum)
I found and removed one tiny polyp - it looks benign. I will let you know pathology results and when to have another routine colonoscopy by mail.  I appreciate the opportunity to care for you. Gatha Mayer, MD, FACG   YOU HAD AN ENDOSCOPIC PROCEDURE TODAY AT [facility]:   Refer to the procedure report that was given to you for any specific questions about what was found during the examination.  If the procedure report does not answer your questions, please call your gastroenterologist to clarify.  If you requested that your care partner not be given the details of your procedure findings, then the procedure report has been included in a sealed envelope for you to review at your convenience later.  YOU SHOULD EXPECT: Some feelings of bloating in the abdomen. Passage of more gas than usual.  Walking can help get rid of the air that was put into your GI tract during the procedure and reduce the bloating. If you had a lower endoscopy (such as a colonoscopy or flexible sigmoidoscopy) you may notice spotting of blood in your stool or on the toilet paper. If you underwent a bowel prep for your procedure, you may not have a normal bowel movement for a few days.  Please Note:  You might notice some irritation and congestion in your nose or some drainage.  This is from the oxygen used during your procedure.  There is no need for concern and it should clear up in a day or so.  SYMPTOMS TO REPORT IMMEDIATELY:   Following lower endoscopy (colonoscopy or flexible sigmoidoscopy):  Excessive amounts of blood in the stool  Significant tenderness or worsening of abdominal pains  Swelling of the abdomen that is new, acute  Fever of 100F or higher   For urgent or emergent issues, a gastroenterologist can be reached at any hour by calling [phone].   DIET: Your first meal following the procedure should be a small meal and then it is ok to progress to your normal diet. Heavy or  fried foods are harder to digest and may make you feel nauseous or bloated.  Likewise, meals heavy in dairy and vegetables can increase bloating.  Drink plenty of fluids but you should avoid alcoholic beverages for 24 hours.  ACTIVITY:  You should plan to take it easy for the rest of today and you should NOT DRIVE or use heavy machinery until tomorrow (because of the sedation medicines used during the test).    FOLLOW UP: Our staff will call the number listed on your records the next business day following your procedure to check on you and address any questions or concerns that you may have regarding the information given to you following your procedure. If we do not reach you, we will leave a message.  However, if you are feeling well and you are not experiencing any problems, there is no need to return our call.  We will assume that you have returned to your regular daily activities without incident.  If any biopsies were taken you will be contacted by phone or by letter within the next 1-3 weeks.  Please call us at [phone] if you have not heard about the biopsies in 3 weeks.    SIGNATURES/CONFIDENTIALITY: You and/or your care partner have signed paperwork which will be entered into your electronic medical record.  These signatures attest to the fact that that the information above on your After Visit Summary has been reviewed and  is understood.  Full responsibility of the confidentiality of this discharge information lies with you and/or your care-partner.  Read all of the handouts given to you by your recovery room nurse.

## 2015-02-18 NOTE — Progress Notes (Signed)
Transferred to recovery room. A/O x3, pleased with MAC.  VSS.  Report to Suzanne, RN. 

## 2015-02-18 NOTE — Progress Notes (Signed)
Called to room to assist during endoscopic procedure.  Patient ID and intended procedure confirmed with present staff. Received instructions for my participation in the procedure from the performing physician.  

## 2015-02-21 ENCOUNTER — Telehealth: Payer: Self-pay | Admitting: *Deleted

## 2015-02-21 NOTE — Telephone Encounter (Signed)
  Follow up Call-  Call back number 02/18/2015  Post procedure Call Back phone  # [phone]  Permission to leave phone message Yes     Patient questions:  Do you have a fever, pain , or abdominal swelling? No. Pain Score  0 *  Have you tolerated food without any problems? Yes.    Have you been able to return to your normal activities? Yes.    Do you have any questions about your discharge instructions: Diet   No. Medications  No. Follow up visit  No.  Do you have questions or concerns about your Care? No.  Actions: * If pain score is 4 or above: No action needed, pain <4.

## 2015-02-28 ENCOUNTER — Encounter: Payer: Self-pay | Admitting: Internal Medicine

## 2015-02-28 NOTE — Progress Notes (Signed)
Quick Note:  Not precancerous Repeat screening 10 yrs 2026 ______

## 2015-05-16 ENCOUNTER — Telehealth: Payer: Self-pay | Admitting: Family Medicine

## 2015-05-16 NOTE — Telephone Encounter (Signed)
4:15

## 2015-05-16 NOTE — Telephone Encounter (Signed)
Pt ears are clogged and would like an appt today or tomorrow. Can I create slot?

## 2015-05-16 NOTE — Telephone Encounter (Signed)
Pt has been sch at 4 pm slot

## 2015-05-16 NOTE — Telephone Encounter (Signed)
Okay to schedule tomorrow at 11:15

## 2015-05-16 NOTE — Telephone Encounter (Signed)
Pt can not come in around 11:15. Pt must take dad to appt. Can I use later time ?

## 2015-05-17 ENCOUNTER — Encounter: Payer: Self-pay | Admitting: Family Medicine

## 2015-05-17 ENCOUNTER — Ambulatory Visit (INDEPENDENT_AMBULATORY_CARE_PROVIDER_SITE_OTHER): Payer: BC Managed Care – PPO | Admitting: Family Medicine

## 2015-05-17 VITALS — BP 120/80 | Temp 98.5°F | Wt 173.0 lb

## 2015-05-17 DIAGNOSIS — Z23 Encounter for immunization: Secondary | ICD-10-CM | POA: Diagnosis not present

## 2015-05-17 DIAGNOSIS — H9193 Unspecified hearing loss, bilateral: Secondary | ICD-10-CM | POA: Diagnosis not present

## 2015-05-17 DIAGNOSIS — H9190 Unspecified hearing loss, unspecified ear: Secondary | ICD-10-CM | POA: Insufficient documentation

## 2015-05-17 NOTE — Progress Notes (Signed)
Pre visit review using our clinic review tool, if applicable. No additional management support is needed unless otherwise documented below in the visit note. 

## 2015-05-17 NOTE — Patient Instructions (Signed)
Return when necessary 

## 2015-05-17 NOTE — Progress Notes (Signed)
   Subjective:    Patient ID: Kenneth York, male    DOB: [birth year], 64 y.o.   MRN: [id number]  HPI [name] is a 64 year old male who comes in today because of bilateral hearing loss     Review of Systems Review of systems otherwise negative    Objective:   Physical Exam  Well-developed well-nourished male no acute distress vital signs stable he is afebrile examination urinalysis and throat were negative except for bilateral cerumen impactions.  This really impactions removed with irrigation      Assessment & Plan:  Hearing loss secondary to cerumen impaction.......... cerumen removed with irrigation

## 2015-09-28 ENCOUNTER — Other Ambulatory Visit (INDEPENDENT_AMBULATORY_CARE_PROVIDER_SITE_OTHER): Payer: BC Managed Care – PPO

## 2015-09-28 DIAGNOSIS — Z0000 Encounter for general adult medical examination without abnormal findings: Secondary | ICD-10-CM | POA: Diagnosis not present

## 2015-09-28 DIAGNOSIS — E039 Hypothyroidism, unspecified: Secondary | ICD-10-CM | POA: Diagnosis not present

## 2015-09-28 LAB — BASIC METABOLIC PANEL
BUN: 21 mg/dL (ref 6–23)
CO2: 28 mEq/L (ref 19–32)
Calcium: 8.9 mg/dL (ref 8.4–10.5)
Chloride: 103 mEq/L (ref 96–112)
Creatinine, Ser: 1.12 mg/dL (ref 0.40–1.50)
GFR: 70.06 mL/min (ref >60.00)
GLUCOSE: 91 mg/dL (ref 70–99)
POTASSIUM: 4.6 meq/L (ref 3.5–5.1)
Sodium: 136 mEq/L (ref 135–145)

## 2015-09-28 LAB — CBC WITH DIFFERENTIAL/PLATELET
BASOS ABS: 0 10*3/uL (ref 0.0–0.1)
BASOS PCT: 0.6 % (ref 0.0–3.0)
EOS PCT: 2.9 % (ref 0.0–5.0)
Eosinophils Absolute: 0.1 10*3/uL (ref 0.0–0.7)
HEMATOCRIT: 41.6 % (ref 39.0–52.0)
Hemoglobin: 14.2 g/dL (ref 13.0–17.0)
LYMPHS PCT: 29.1 % (ref 12.0–46.0)
Lymphs Abs: 1.5 10*3/uL (ref 0.7–4.0)
MCHC: 34.2 g/dL (ref 30.0–36.0)
MCV: 90.9 fl (ref 78.0–100.0)
MONOS PCT: 14.6 % — AB (ref 3.0–12.0)
Monocytes Absolute: 0.7 10*3/uL (ref 0.1–1.0)
NEUTROS ABS: 2.6 10*3/uL (ref 1.4–7.7)
Neutrophils Relative %: 52.8 % (ref 43.0–77.0)
PLATELETS: 190 10*3/uL (ref 150.0–400.0)
RBC: 4.58 Mil/uL (ref 4.22–5.81)
RDW: 13.3 % (ref 11.5–15.5)
WBC: 5 10*3/uL (ref 4.0–10.5)

## 2015-09-28 LAB — POC URINALSYSI DIPSTICK (AUTOMATED)
Bilirubin, UA: NEGATIVE
Blood, UA: NEGATIVE
GLUCOSE UA: NEGATIVE
KETONES UA: NEGATIVE
Leukocytes, UA: NEGATIVE
Nitrite, UA: NEGATIVE
PROTEIN UA: NEGATIVE
SPEC GRAV UA: 1.01
Urobilinogen, UA: 0.2
pH, UA: 6.5

## 2015-09-28 LAB — TSH
TSH: 1.22 u[IU]/mL (ref 0.35–4.50)
TSH: 1.25 u[IU]/mL (ref 0.35–4.50)

## 2015-09-28 LAB — HEPATIC FUNCTION PANEL
ALBUMIN: 4 g/dL (ref 3.5–5.2)
ALK PHOS: 31 U/L — AB (ref 39–117)
ALT: 13 U/L (ref 0–53)
AST: 19 U/L (ref 0–37)
Bilirubin, Direct: 0.1 mg/dL (ref 0.0–0.3)
TOTAL PROTEIN: 6.1 g/dL (ref 6.0–8.3)
Total Bilirubin: 0.5 mg/dL (ref 0.2–1.2)

## 2015-09-28 LAB — LIPID PANEL
Cholesterol: 184 mg/dL (ref 0–200)
HDL: 55.9 mg/dL (ref >39.00)
LDL CALC: 117 mg/dL — AB (ref 0–99)
NONHDL: 128.25
Total CHOL/HDL Ratio: 3
Triglycerides: 58 mg/dL (ref 0.0–149.0)
VLDL: 11.6 mg/dL (ref 0.0–40.0)

## 2015-09-28 LAB — PSA: PSA: 0.84 ng/mL (ref 0.10–4.00)

## 2015-10-04 ENCOUNTER — Encounter: Payer: BC Managed Care – PPO | Admitting: Family Medicine

## 2015-10-11 ENCOUNTER — Encounter: Payer: Self-pay | Admitting: Family Medicine

## 2015-10-11 ENCOUNTER — Ambulatory Visit (INDEPENDENT_AMBULATORY_CARE_PROVIDER_SITE_OTHER): Payer: BC Managed Care – PPO | Admitting: Family Medicine

## 2015-10-11 VITALS — BP 120/84 | Temp 98.3°F | Ht 71.5 in | Wt 176.0 lb

## 2015-10-11 DIAGNOSIS — M4124 Other idiopathic scoliosis, thoracic region: Secondary | ICD-10-CM

## 2015-10-11 DIAGNOSIS — Z0000 Encounter for general adult medical examination without abnormal findings: Secondary | ICD-10-CM | POA: Diagnosis not present

## 2015-10-11 DIAGNOSIS — J302 Other seasonal allergic rhinitis: Secondary | ICD-10-CM | POA: Diagnosis not present

## 2015-10-11 DIAGNOSIS — H9193 Unspecified hearing loss, bilateral: Secondary | ICD-10-CM | POA: Diagnosis not present

## 2015-10-11 DIAGNOSIS — M419 Scoliosis, unspecified: Secondary | ICD-10-CM

## 2015-10-11 NOTE — Progress Notes (Signed)
Pre visit review using our clinic review tool, if applicable. No additional management support is needed unless otherwise documented below in the visit note. 

## 2015-10-11 NOTE — Patient Instructions (Signed)
Continue good health habits  I would recommend Dr. [name] neurosurgeon for evaluation of your back  Return in one year for general physical exam sooner if any problems,,,,,,,,,,,, Kenneth York or Kenneth York are 2 new adult nurse practitioner's,,,,,, or Dr. Martinique

## 2015-10-11 NOTE — Progress Notes (Signed)
   Subjective:    Patient ID: Kenneth York, male    DOB: [birth year], 65 y.o.   MRN: [id number]  HPI Kaylem is a 65 year old married male nonsmoker who comes in today for general physical examination  He's always been Health he has no chronic health problems. He takes Zyrtec and steroid nasal spray when necessary for allergic rhinitis  He gets routine eye care, dental care, colonoscopy 2016 normal  Vaccinations up-to-date  His only long-term problem is his severe thoracic scoliosis. He recently had a steroid injection from an orthopedist. His father has seen Dr. Ellene Route in the past  Family history of prostate cancer,,,,,,,,, his father   Review of Systems  Constitutional: Negative.   HENT: Negative.   Eyes: Negative.   Respiratory: Negative.   Cardiovascular: Negative.   Gastrointestinal: Negative.   Endocrine: Negative.   Genitourinary: Negative.   Musculoskeletal: Negative.   Skin: Negative.   Allergic/Immunologic: Negative.   Neurological: Negative.   Hematological: Negative.   Psychiatric/Behavioral: Negative.        Objective:   Physical Exam  Constitutional: He is oriented to person, place, and time. He appears well-developed and well-nourished.  HENT:  Head: Normocephalic and atraumatic.  Right Ear: External ear normal.  Left Ear: External ear normal.  Nose: Nose normal.  Mouth/Throat: Oropharynx is clear and moist.  Eyes: Conjunctivae and EOM are normal. Pupils are equal, round, and reactive to light.  Neck: Normal range of motion. Neck supple. No JVD present. No tracheal deviation present. No thyromegaly present.  Cardiovascular: Normal rate, regular rhythm, normal heart sounds and intact distal pulses.  Exam reveals no gallop and no friction rub.   No murmur heard. No carotid bruits aorta normal peripheral pulses 2+ and symmetrical  Pulmonary/Chest: Effort normal and breath sounds normal. No stridor. No respiratory distress. He has no wheezes. He has no  rales. He exhibits no tenderness.  Abdominal: Soft. Bowel sounds are normal. He exhibits no distension and no mass. There is no tenderness. There is no rebound and no guarding.  Genitourinary: Rectum normal, prostate normal and penis normal. Guaiac negative stool. No penile tenderness.  Musculoskeletal: Normal range of motion. He exhibits no edema or tenderness.  Severe thoracic scoliosis  Lymphadenopathy:    He has no cervical adenopathy.  Neurological: He is alert and oriented to person, place, and time. He has normal reflexes. No cranial nerve deficit. He exhibits normal muscle tone.  Skin: Skin is warm and dry. No rash noted. No erythema. No pallor.  Psychiatric: He has a normal mood and affect. His behavior is normal. Judgment and thought content normal.  Nursing note and vitals reviewed.         Assessment & Plan:  Healthy male  Severe thoracic scoliosis..........Marland Kitchen recommend consult with neurosurgeon Dr. Ellene Route  Allergic rhinitis.  Family history of prostate cancer,,, his father,,,,, recommended annual exams and PSAs

## 2016-07-25 DIAGNOSIS — R69 Illness, unspecified: Secondary | ICD-10-CM | POA: Diagnosis not present

## 2016-08-15 ENCOUNTER — Other Ambulatory Visit: Payer: Self-pay | Admitting: Dermatology

## 2016-08-15 DIAGNOSIS — L821 Other seborrheic keratosis: Secondary | ICD-10-CM | POA: Diagnosis not present

## 2016-08-15 DIAGNOSIS — D2262 Melanocytic nevi of left upper limb, including shoulder: Secondary | ICD-10-CM | POA: Diagnosis not present

## 2016-08-15 DIAGNOSIS — L570 Actinic keratosis: Secondary | ICD-10-CM | POA: Diagnosis not present

## 2016-08-15 DIAGNOSIS — D229 Melanocytic nevi, unspecified: Secondary | ICD-10-CM | POA: Diagnosis not present

## 2016-08-15 DIAGNOSIS — D485 Neoplasm of uncertain behavior of skin: Secondary | ICD-10-CM | POA: Diagnosis not present

## 2016-08-23 DIAGNOSIS — M5442 Lumbago with sciatica, left side: Secondary | ICD-10-CM | POA: Diagnosis not present

## 2016-08-23 DIAGNOSIS — M545 Low back pain: Secondary | ICD-10-CM | POA: Diagnosis not present

## 2016-08-23 DIAGNOSIS — M5126 Other intervertebral disc displacement, lumbar region: Secondary | ICD-10-CM | POA: Diagnosis not present

## 2016-08-23 DIAGNOSIS — M4155 Other secondary scoliosis, thoracolumbar region: Secondary | ICD-10-CM | POA: Diagnosis not present

## 2016-09-07 DIAGNOSIS — M5126 Other intervertebral disc displacement, lumbar region: Secondary | ICD-10-CM | POA: Diagnosis not present

## 2016-09-20 DIAGNOSIS — R69 Illness, unspecified: Secondary | ICD-10-CM | POA: Diagnosis not present

## 2016-10-03 DIAGNOSIS — M5126 Other intervertebral disc displacement, lumbar region: Secondary | ICD-10-CM | POA: Diagnosis not present

## 2016-10-10 DIAGNOSIS — M545 Low back pain: Secondary | ICD-10-CM | POA: Diagnosis not present

## 2016-10-10 DIAGNOSIS — M5126 Other intervertebral disc displacement, lumbar region: Secondary | ICD-10-CM | POA: Diagnosis not present

## 2016-10-10 DIAGNOSIS — M5442 Lumbago with sciatica, left side: Secondary | ICD-10-CM | POA: Diagnosis not present

## 2016-10-10 DIAGNOSIS — M4155 Other secondary scoliosis, thoracolumbar region: Secondary | ICD-10-CM | POA: Diagnosis not present

## 2016-12-18 ENCOUNTER — Ambulatory Visit: Payer: Self-pay | Admitting: Adult Health

## 2017-01-03 ENCOUNTER — Ambulatory Visit: Payer: Self-pay | Admitting: Adult Health

## 2017-01-16 ENCOUNTER — Encounter: Payer: Self-pay | Admitting: Adult Health

## 2017-01-16 ENCOUNTER — Ambulatory Visit (INDEPENDENT_AMBULATORY_CARE_PROVIDER_SITE_OTHER): Payer: Medicare HMO | Admitting: Adult Health

## 2017-01-16 VITALS — BP 120/80 | HR 64 | Ht 71.5 in | Wt 171.1 lb

## 2017-01-16 DIAGNOSIS — Z0000 Encounter for general adult medical examination without abnormal findings: Secondary | ICD-10-CM | POA: Diagnosis not present

## 2017-01-16 DIAGNOSIS — Z1159 Encounter for screening for other viral diseases: Secondary | ICD-10-CM

## 2017-01-16 DIAGNOSIS — Z23 Encounter for immunization: Secondary | ICD-10-CM

## 2017-01-16 LAB — BASIC METABOLIC PANEL
BUN: 18 mg/dL (ref 6–23)
CO2: 30 meq/L (ref 19–32)
Calcium: 9.2 mg/dL (ref 8.4–10.5)
Chloride: 103 mEq/L (ref 96–112)
Creatinine, Ser: 1 mg/dL (ref 0.40–1.50)
GFR: 79.53 mL/min (ref >60.00)
GLUCOSE: 91 mg/dL (ref 70–99)
POTASSIUM: 4.4 meq/L (ref 3.5–5.1)
SODIUM: 139 meq/L (ref 135–145)

## 2017-01-16 LAB — CBC WITH DIFFERENTIAL/PLATELET
Basophils Absolute: 0 10*3/uL (ref 0.0–0.1)
Basophils Relative: 0.7 % (ref 0.0–3.0)
EOS PCT: 2.3 % (ref 0.0–5.0)
Eosinophils Absolute: 0.1 10*3/uL (ref 0.0–0.7)
HCT: 44.1 % (ref 39.0–52.0)
Hemoglobin: 14.7 g/dL (ref 13.0–17.0)
LYMPHS ABS: 1.2 10*3/uL (ref 0.7–4.0)
Lymphocytes Relative: 25.5 % (ref 12.0–46.0)
MCHC: 33.4 g/dL (ref 30.0–36.0)
MCV: 92.9 fl (ref 78.0–100.0)
MONOS PCT: 11.6 % (ref 3.0–12.0)
Monocytes Absolute: 0.5 10*3/uL (ref 0.1–1.0)
NEUTROS ABS: 2.8 10*3/uL (ref 1.4–7.7)
NEUTROS PCT: 59.9 % (ref 43.0–77.0)
PLATELETS: 185 10*3/uL (ref 150.0–400.0)
RBC: 4.75 Mil/uL (ref 4.22–5.81)
RDW: 12.9 % (ref 11.5–15.5)
WBC: 4.6 10*3/uL (ref 4.0–10.5)

## 2017-01-16 LAB — HEPATIC FUNCTION PANEL
ALBUMIN: 4.3 g/dL (ref 3.5–5.2)
ALT: 14 U/L (ref 0–53)
AST: 22 U/L (ref 0–37)
Alkaline Phosphatase: 37 U/L — ABNORMAL LOW (ref 39–117)
Bilirubin, Direct: 0.1 mg/dL (ref 0.0–0.3)
Total Bilirubin: 0.6 mg/dL (ref 0.2–1.2)
Total Protein: 6.5 g/dL (ref 6.0–8.3)

## 2017-01-16 LAB — LIPID PANEL
CHOLESTEROL: 184 mg/dL (ref 0–200)
HDL: 65.8 mg/dL (ref >39.00)
LDL Cholesterol: 108 mg/dL — ABNORMAL HIGH (ref 0–99)
NonHDL: 118.13
TRIGLYCERIDES: 50 mg/dL (ref 0.0–149.0)
Total CHOL/HDL Ratio: 3
VLDL: 10 mg/dL (ref 0.0–40.0)

## 2017-01-16 LAB — TSH: TSH: 0.92 u[IU]/mL (ref 0.35–4.50)

## 2017-01-16 LAB — PSA: PSA: 1.58 ng/mL (ref 0.10–4.00)

## 2017-01-16 NOTE — Addendum Note (Signed)
Addended by: Westley Hummer B on: 01/16/2017 09:31 AM   Modules accepted: Orders

## 2017-01-16 NOTE — Patient Instructions (Signed)
It was great meeting you today   I will follow up with you regarding your blood work   Please follow up with me in one year or sooner if needed  Health Maintenance, Male A healthy lifestyle and preventative care can promote health and wellness.  Maintain regular health, dental, and eye exams.  Eat a healthy diet. Foods like vegetables, fruits, whole grains, low-fat dairy products, and lean protein foods contain the nutrients you need and are low in calories. Decrease your intake of foods high in solid fats, added sugars, and salt. Get information about a proper diet from your health care provider, if necessary.  Regular physical exercise is one of the most important things you can do for your health. Most adults should get at least 150 minutes of moderate-intensity exercise (any activity that increases your heart rate and causes you to sweat) each week. In addition, most adults need muscle-strengthening exercises on 2 or more days a week.   Maintain a healthy weight. The body mass index (BMI) is a screening tool to identify possible weight problems. It provides an estimate of body fat based on height and weight. Your health care provider can find your BMI and can help you achieve or maintain a healthy weight. For males 20 years and older:  A BMI below 18.5 is considered underweight.  A BMI of 18.5 to 24.9 is normal.  A BMI of 25 to 29.9 is considered overweight.  A BMI of 30 and above is considered obese.  Maintain normal blood lipids and cholesterol by exercising and minimizing your intake of saturated fat. Eat a balanced diet with plenty of fruits and vegetables. Blood tests for lipids and cholesterol should begin at age 22 and be repeated every 5 years. If your lipid or cholesterol levels are high, you are over age 44, or you are at high risk for heart disease, you may need your cholesterol levels checked more frequently.Ongoing high lipid and cholesterol levels should be treated with  medicines if diet and exercise are not working.  If you smoke, find out from your health care provider how to quit. If you do not use tobacco, do not start.  Lung cancer screening is recommended for adults aged 54-80 years who are at high risk for developing lung cancer because of a history of smoking. A yearly low-dose CT scan of the lungs is recommended for people who have at least a 30-pack-year history of smoking and are current smokers or have quit within the past 15 years. A pack year of smoking is smoking an average of 1 pack of cigarettes a day for 1 year (for example, a 30-pack-year history of smoking could mean smoking 1 pack a day for 30 years or 2 packs a day for 15 years). Yearly screening should continue until the smoker has stopped smoking for at least 15 years. Yearly screening should be stopped for people who develop a health problem that would prevent them from having lung cancer treatment.  If you choose to drink alcohol, do not have more than 2 drinks per day. One drink is considered to be 12 oz (360 mL) of beer, 5 oz (150 mL) of wine, or 1.5 oz (45 mL) of liquor.  Avoid the use of street drugs. Do not share needles with anyone. Ask for help if you need support or instructions about stopping the use of drugs.  High blood pressure causes heart disease and increases the risk of stroke. High blood pressure is more likely  develop in:  People who have blood pressure in the end of the normal range (100-139/85-89 mm Hg).  People who are overweight or obese.  People who are African American.  If you are 18-39 years of age, have your blood pressure checked every 3-5 years. If you are 40 years of age or older, have your blood pressure checked every year. You should have your blood pressure measured twice--once when you are at a hospital or clinic, and once when you are not at a hospital or clinic. Record the average of the two measurements. To check your blood pressure when you are not  at a hospital or clinic, you can use:  An automated blood pressure machine at a pharmacy.  A home blood pressure monitor.  If you are 45-79 years old, ask your health care provider if you should take aspirin to prevent heart disease.  Diabetes screening involves taking a blood sample to check your fasting blood sugar level. This should be done once every 3 years after age 45 if you are at a normal weight and without risk factors for diabetes. Testing should be considered at a younger age or be carried out more frequently if you are overweight and have at least 1 risk factor for diabetes.  Colorectal cancer can be detected and often prevented. Most routine colorectal cancer screening begins at the age of 50 and continues through age 75. However, your health care provider may recommend screening at an earlier age if you have risk factors for colon cancer. On a yearly basis, your health care provider may provide home test kits to check for hidden blood in the stool. A small camera at the end of a tube may be used to directly examine the colon (sigmoidoscopy or colonoscopy) to detect the earliest forms of colorectal cancer. Talk to your health care provider about this at age 50 when routine screening begins. A direct exam of the colon should be repeated every 5-10 years through age 75, unless early forms of precancerous polyps or small growths are found.  People who are at an increased risk for hepatitis B should be screened for this virus. You are considered at high risk for hepatitis B if:  You were born in a country where hepatitis B occurs often. Talk with your health care provider about which countries are considered high risk.  Your parents were born in a high-risk country and you have not received a shot to protect against hepatitis B (hepatitis B vaccine).  You have HIV or AIDS.  You use needles to inject street drugs.  You live with, or have sex with, someone who has hepatitis B.  You  are a man who has sex with other men (MSM).  You get hemodialysis treatment.  You take certain medicines for conditions like cancer, organ transplantation, and autoimmune conditions.  Hepatitis C blood testing is recommended for all people born from 1945 through 1965 and any individual with known risk factors for hepatitis C.  Healthy men should no longer receive prostate-specific antigen (PSA) blood tests as part of routine cancer screening. Talk to your health care provider about prostate cancer screening.  Testicular cancer screening is not recommended for adolescents or adult males who have no symptoms. Screening includes self-exam, a health care provider exam, and other screening tests. Consult with your health care provider about any symptoms you have or any concerns you have about testicular cancer.  Practice safe sex. Use condoms and avoid high-risk sexual practices to reduce   the spread of sexually transmitted infections (STIs).  You should be screened for STIs, including gonorrhea and chlamydia if:  You are sexually active and are younger than 24 years.  You are older than 24 years, and your health care provider tells you that you are at risk for this type of infection.  Your sexual activity has changed since you were last screened, and you are at an increased risk for chlamydia or gonorrhea. Ask your health care provider if you are at risk.  If you are at risk of being infected with HIV, it is recommended that you take a prescription medicine daily to prevent HIV infection. This is called pre-exposure prophylaxis (PrEP). You are considered at risk if:  You are a man who has sex with other men (MSM).  You are a heterosexual man who is sexually active with multiple partners.  You take drugs by injection.  You are sexually active with a partner who has HIV.  Talk with your health care provider about whether you are at high risk of being infected with HIV. If you choose to begin  PrEP, you should first be tested for HIV. You should then be tested every 3 months for as long as you are taking PrEP.  Use sunscreen. Apply sunscreen liberally and repeatedly throughout the day. You should seek shade when your shadow is shorter than you. Protect yourself by wearing long sleeves, pants, a wide-brimmed hat, and sunglasses year round whenever you are outdoors.  Tell your health care provider of new moles or changes in moles, especially if there is a change in shape or color. Also, tell your health care provider if a mole is larger than the size of a pencil eraser.  A one-time screening for abdominal aortic aneurysm (AAA) and surgical repair of large AAAs by ultrasound is recommended for men aged 65-75 years who are current or former smokers.  Stay current with your vaccines (immunizations).   This information is not intended to replace advice given to you by your health care provider. Make sure you discuss any questions you have with your health care provider.   Document Released: 01/26/2008 Document Revised: 08/20/2014 Document Reviewed: 12/25/2010 Elsevier Interactive Patient Education 2016 Elsevier Inc.   

## 2017-01-16 NOTE — Progress Notes (Signed)
Patient presents to clinic today to establish care. He is a pleasant 66 year old male who  has a past medical history of Allergy; Cancer [facility]); and Scoliosis of thoracic spine.   Acute Concerns: Establish Care/CPE   Chronic Issues: Seasonal Allergies - Takes OTC medication   Health Maintenance: Dental -- Routine Care  Vision --Routine Care - [facility]  Immunizations -- Needs Prevnar 13 Colonoscopy --2016   Is followed by  - Orthopedics  - Dermatology     Past Medical History:  Diagnosis Date  . Allergy   . Cancer ([place])    basal cell   . Scoliosis of thoracic spine     Past Surgical History:  Procedure Laterality Date  . MOHS SURGERY  2004    Current Outpatient Prescriptions on File Prior to Visit  Medication Sig Dispense Refill  . aspirin 81 MG tablet Take 81 mg by mouth daily.    . cetirizine (ZYRTEC) 10 MG tablet Take 10 mg by mouth daily.    . fluticasone (FLONASE) 50 MCG/ACT nasal spray Place 2 sprays into the nose daily. 16 g 11   No current facility-administered medications on file prior to visit.     No Known Allergies  Family History  Problem Relation Age of Onset  . Colon cancer Neg Hx   . Esophageal cancer Neg Hx   . Rectal cancer Neg Hx   . Stomach cancer Neg Hx     Social History   Social History  . Marital status: Married    Spouse name: N/A  . Number of children: N/A  . Years of education: N/A   Occupational History  . Not on file.   Social History Main Topics  . Smoking status: Never Smoker  . Smokeless tobacco: Never Used  . Alcohol use 2.4 oz/week    4 Cans of beer per week  . Drug use: No  . Sexual activity: Not on file   Other Topics Concern  . Not on file   Social History Narrative  . No narrative on file    Review of Systems  Constitutional: Negative.   HENT: Negative.   Eyes: Negative.   Respiratory: Negative.   Cardiovascular: Negative.   Gastrointestinal: Negative.   Genitourinary: Negative.     Musculoskeletal: Negative.   Skin: Negative.   Neurological: Negative.   Endo/Heme/Allergies: Negative.   Psychiatric/Behavioral: Negative.   All other systems reviewed and are negative.   BP 120/80 (BP Location: Left Arm, Patient Position: Sitting, Cuff Size: Normal)   Pulse 64   Ht 5' 11.5" (1.816 m)   Wt 171 lb 1.6 oz (77.6 kg)   BMI 23.53 kg/m   Physical Exam  Constitutional: He is oriented to person, place, and time and well-developed, well-nourished, and in no distress. No distress.  HENT:  Head: Normocephalic and atraumatic.  Right Ear: External ear normal.  Left Ear: External ear normal.  Nose: Nose normal.  Mouth/Throat: Oropharynx is clear and moist. No oropharyngeal exudate.  Eyes: Conjunctivae and EOM are normal. Pupils are equal, round, and reactive to light. Right eye exhibits no discharge. Left eye exhibits no discharge. No scleral icterus.  Neck: Normal range of motion. Neck supple. No JVD present. No tracheal deviation present. No thyromegaly present.  Cardiovascular: Normal rate, regular rhythm, normal heart sounds and intact distal pulses.  Exam reveals no gallop and no friction rub.   No murmur heard. Pulmonary/Chest: Effort normal and breath sounds normal. No stridor. No respiratory  distress. He has no wheezes. He has no rales. He exhibits no tenderness.  Abdominal: Soft. Bowel sounds are normal. He exhibits no distension and no mass. There is no tenderness. There is no rebound and no guarding.  Musculoskeletal: Normal range of motion. He exhibits no edema, tenderness or deformity.  Scoliosis noted   Lymphadenopathy:    He has no cervical adenopathy.  Neurological: He is alert and oriented to person, place, and time. He displays normal reflexes. No cranial nerve deficit. He exhibits normal muscle tone. Coordination normal. GCS score is 15.  Skin: Skin is warm and dry. No rash noted. He is not diaphoretic. No erythema. No pallor.  Surgical scar on right face  from MOHS surgery   Psychiatric: Mood, memory, affect and judgment normal.  Nursing note and vitals reviewed.  Assessment/Plan: 1. Routine general medical examination at a health care facility - Benign Exam  - Continue to eat healthy and stay active  - Follow up in one year or sooner if needed - Basic metabolic panel - CBC with Differential/Platelet - Hepatic function panel - Lipid panel - PSA - TSH - HIV antibody  2. Need for vaccination against Streptococcus pneumoniae using pneumococcal conjugate vaccine 13 - Given   3. Need for hepatitis C screening test  - Hep C Antibody   Dorothyann Peng, NP

## 2017-01-17 LAB — HIV ANTIBODY (ROUTINE TESTING W REFLEX): HIV: NONREACTIVE

## 2017-01-17 LAB — HEPATITIS C ANTIBODY: HCV Ab: NEGATIVE

## 2017-02-20 ENCOUNTER — Encounter: Payer: Self-pay | Admitting: Adult Health

## 2017-02-20 ENCOUNTER — Ambulatory Visit (INDEPENDENT_AMBULATORY_CARE_PROVIDER_SITE_OTHER): Payer: Medicare HMO | Admitting: Adult Health

## 2017-02-20 VITALS — BP 100/80 | HR 92 | Temp 98.3°F | Ht 71.5 in | Wt 173.2 lb

## 2017-02-20 DIAGNOSIS — H6501 Acute serous otitis media, right ear: Secondary | ICD-10-CM | POA: Diagnosis not present

## 2017-02-20 NOTE — Progress Notes (Signed)
Subjective:    Patient ID: Kenneth York, male    DOB: 1950/12/21, 66 y.o.   MRN: [id number]  Otalgia   There is pain in both ears. This is a new problem. The current episode started in the past 7 days. The problem occurs constantly. There has been no fever. Associated symptoms include coughing. Pertinent negatives include no ear discharge, headaches, hearing loss, rhinorrhea or sore throat. He has tried nothing for the symptoms. There is no history of a chronic ear infection.      Review of Systems  Constitutional: Negative.   HENT: Positive for ear pain. Negative for ear discharge, hearing loss, postnasal drip, rhinorrhea, sinus pain, sinus pressure and sore throat.   Eyes: Negative.   Respiratory: Positive for cough. Negative for chest tightness, shortness of breath and wheezing.   Neurological: Negative for headaches.  All other systems reviewed and are negative.  Past Medical History:  Diagnosis Date  . Allergy   . Cancer ([place])    basal cell   . Scoliosis of thoracic spine     Social History   Social History  . Marital status: Married    Spouse name: N/A  . Number of children: N/A  . Years of education: N/A   Occupational History  . Not on file.   Social History Main Topics  . Smoking status: Never Smoker  . Smokeless tobacco: Never Used  . Alcohol use 2.4 oz/week    4 Cans of beer per week  . Drug use: No  . Sexual activity: Not on file   Other Topics Concern  . 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 MOHS SURGERY  2004    Family History  Problem Relation Age of Onset  . Ovarian cancer Mother   . Heart disease Father   . Prostate cancer Father   . Colon cancer Neg Hx   . Esophageal cancer Neg Hx   . Rectal cancer Neg Hx   . Stomach cancer Neg Hx     No Known  Allergies  Current Outpatient Prescriptions on File Prior to Visit  Medication Sig Dispense Refill  . aspirin 81 MG tablet Take 81 mg by mouth daily.    . cetirizine (ZYRTEC) 10 MG tablet Take 10 mg by mouth daily.    . fluticasone (FLONASE) 50 MCG/ACT nasal spray Place 2 sprays into the nose daily. 16 g 11   No current facility-administered medications on file prior to visit.     BP 100/80   Pulse 92   Temp 98.3 F (36.8 C) (Oral)   Ht 5' 11.5" (1.816 m)   Wt 173 lb 3.2 oz (78.6 kg)   BMI 23.82 kg/m       Objective:   Physical Exam  Constitutional: He is oriented to person, place, and time. He appears well-developed and well-nourished. No distress.  HENT:  Right Ear: Hearing, external ear and ear canal normal. Tympanic membrane is bulging. Tympanic membrane is not erythematous.  Left Ear: Hearing, tympanic membrane, external ear and ear canal normal. Tympanic membrane is not erythematous and not bulging.  Cardiovascular: Normal rate, regular rhythm, normal heart sounds and intact distal pulses.  Exam reveals no gallop and no friction rub.   No murmur heard. Pulmonary/Chest: Effort  normal and breath sounds normal. No respiratory distress. He has no wheezes. He has no rales. He exhibits no tenderness.  Neurological: He is alert and oriented to person, place, and time.  Skin: Skin is warm and dry. No rash noted. He is not diaphoretic. No erythema. No pallor.  Psychiatric: He has a normal mood and affect. His behavior is normal. Judgment and thought content normal.  Nursing note and vitals reviewed.     Assessment & Plan:  1. Right acute serous otitis media, recurrence not specified - No signs of infection  - can use Flonase or Afrin x 3 days  - Follow up if no improvement   Dorothyann Peng, NP

## 2017-05-14 DIAGNOSIS — H40013 Open angle with borderline findings, low risk, bilateral: Secondary | ICD-10-CM | POA: Diagnosis not present

## 2017-05-14 DIAGNOSIS — H2513 Age-related nuclear cataract, bilateral: Secondary | ICD-10-CM | POA: Diagnosis not present

## 2017-06-04 ENCOUNTER — Ambulatory Visit (INDEPENDENT_AMBULATORY_CARE_PROVIDER_SITE_OTHER): Payer: Medicare HMO | Admitting: Adult Health

## 2017-06-04 ENCOUNTER — Encounter: Payer: Self-pay | Admitting: Adult Health

## 2017-06-04 VITALS — BP 120/86 | Temp 98.5°F | Wt 171.0 lb

## 2017-06-04 DIAGNOSIS — N401 Enlarged prostate with lower urinary tract symptoms: Secondary | ICD-10-CM

## 2017-06-04 DIAGNOSIS — R350 Frequency of micturition: Secondary | ICD-10-CM

## 2017-06-04 DIAGNOSIS — N138 Other obstructive and reflux uropathy: Secondary | ICD-10-CM

## 2017-06-04 LAB — POC URINALSYSI DIPSTICK (AUTOMATED)
BILIRUBIN UA: NEGATIVE
Blood, UA: NEGATIVE
Glucose, UA: NEGATIVE
KETONES UA: NEGATIVE
LEUKOCYTES UA: NEGATIVE
Nitrite, UA: NEGATIVE
PH UA: 6.5 (ref 5.0–8.0)
Protein, UA: NEGATIVE
SPEC GRAV UA: 1.015 (ref 1.010–1.025)
Urobilinogen, UA: 0.2 E.U./dL

## 2017-06-04 NOTE — Progress Notes (Signed)
Subjective:    Patient ID: Kenneth York, male    DOB: Oct 13, 1950, 66 y.o.   MRN: [id number]  HPI  66 year old old male who  has a past medical history of Allergy; Cancer [facility]); and Scoliosis of thoracic spine. He presents to the office today today for two weeks of " possible" lower abdominal pain and urinary frequency. He also reports more consistent nocturia and decreased stream   He denies any fevers, dysuria, hematuria, n/v/d or pain with defecation    Review of Systems See HPI   Past Medical History:  Diagnosis Date  . Allergy   . Cancer ([place])    basal cell   . Scoliosis of thoracic spine     Social History   Social History  . Marital status: Married    Spouse name: N/A  . Number of children: N/A  . Years of education: N/A   Occupational History  . Not on file.   Social History Main Topics  . Smoking status: Never Smoker  . Smokeless tobacco: Never Used  . Alcohol use 2.4 oz/week    4 Cans of beer per week  . Drug use: No  . Sexual activity: Not on file   Other Topics Concern  . 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 MOHS SURGERY  2004    Family History  Problem Relation Age of Onset  . Ovarian cancer Mother   . Heart disease Father   . Prostate cancer Father   . Colon cancer Neg Hx   . Esophageal cancer Neg Hx   . Rectal cancer Neg Hx   . Stomach cancer Neg Hx     No Known Allergies  Current Outpatient Prescriptions on File Prior to Visit  Medication Sig Dispense Refill  . aspirin 81 MG tablet Take 81 mg by mouth daily.    . cetirizine (ZYRTEC) 10 MG tablet Take 10 mg by mouth daily.    . fluticasone (FLONASE) 50 MCG/ACT nasal spray Place 2 sprays into the nose daily. (Patient not taking: Reported on 06/04/2017) 16 g 11   No current facility-administered medications on  file prior to visit.     BP 120/86 (BP Location: Left Arm)   Temp 98.5 F (36.9 C) (Oral)   Wt 171 lb (77.6 kg)   BMI 23.52 kg/m       Objective:   Physical Exam  Constitutional: He is oriented to person, place, and time. He appears well-developed and well-nourished. No distress.  Cardiovascular: Normal rate, regular rhythm, normal heart sounds and intact distal pulses.  Exam reveals no gallop and no friction rub.   No murmur heard. Pulmonary/Chest: Effort normal and breath sounds normal. No respiratory distress. He has no wheezes. He has no rales. He exhibits no tenderness.  Abdominal: Soft. Bowel sounds are normal. He exhibits no distension and no mass. There is no tenderness. There is no rebound and no guarding.  Genitourinary: Rectal exam shows no mass. Prostate is enlarged. Prostate is not tender.  Neurological: He is alert and oriented to person, place, and time.  Skin: Skin is warm and dry. No rash noted. He is not diaphoretic. No erythema. No pallor.  Psychiatric: He has a normal mood and affect. His behavior is normal. Judgment  and thought content normal.  Vitals reviewed.     Assessment & Plan:  1. Urinary frequency - POCT Urinalysis Dipstick (Automated)- negative  - Prostate was not tender, warm, or boggy.   2. BPH with urinary obstruction - We discussed medication options and he would like to hold off on medications for now.  - He will follow up as needed  Dorothyann Peng, NP

## 2017-06-27 ENCOUNTER — Ambulatory Visit (INDEPENDENT_AMBULATORY_CARE_PROVIDER_SITE_OTHER): Payer: Medicare HMO

## 2017-06-27 DIAGNOSIS — Z23 Encounter for immunization: Secondary | ICD-10-CM | POA: Diagnosis not present

## 2017-09-11 DIAGNOSIS — H11152 Pinguecula, left eye: Secondary | ICD-10-CM | POA: Diagnosis not present

## 2017-09-26 DIAGNOSIS — R69 Illness, unspecified: Secondary | ICD-10-CM | POA: Diagnosis not present

## 2018-01-10 ENCOUNTER — Ambulatory Visit: Payer: Medicare HMO | Admitting: Podiatry

## 2018-01-10 ENCOUNTER — Ambulatory Visit (INDEPENDENT_AMBULATORY_CARE_PROVIDER_SITE_OTHER): Payer: Medicare HMO

## 2018-01-10 DIAGNOSIS — M79675 Pain in left toe(s): Secondary | ICD-10-CM

## 2018-01-10 DIAGNOSIS — M205X2 Other deformities of toe(s) (acquired), left foot: Secondary | ICD-10-CM

## 2018-01-10 DIAGNOSIS — M109 Gout, unspecified: Secondary | ICD-10-CM

## 2018-01-10 LAB — URIC ACID: Uric Acid, Serum: 5.3 mg/dL (ref 4.0–8.0)

## 2018-01-10 MED ORDER — METHYLPREDNISOLONE 4 MG PO TBPK: ORAL_TABLET | ORAL | 0 refills | Status: DC

## 2018-01-10 MED ORDER — MELOXICAM 7.5 MG PO TABS: 7.5000 mg | ORAL_TABLET | Freq: Every day | ORAL | 0 refills | Status: DC

## 2018-01-10 NOTE — Patient Instructions (Signed)

## 2018-01-10 NOTE — Progress Notes (Addendum)
  Subjective:  Patient ID: Kenneth York, male    DOB: Jan 03, 1951,  MRN: [id number]  No chief complaint on file.  67 y.o. male presents with the above complaint.  Reports left great toe pain for couple weeks.  States that he went away but came back 3 days ago after walking uphill a lot.  Denies having this issue before denies prior treatments.  Past Medical History:  Diagnosis Date  . Allergy   . Cancer ([place])    basal cell   . Scoliosis of thoracic spine    Past Surgical History:  Procedure Laterality Date  . MOHS SURGERY  2004    Current Outpatient Medications:  .  aspirin 81 MG tablet, Take 81 mg by mouth daily., Disp: , Rfl:  .  cetirizine (ZYRTEC) 10 MG tablet, Take 10 mg by mouth daily., Disp: , Rfl:  .  fluticasone (FLONASE) 50 MCG/ACT nasal spray, Place 2 sprays into the nose daily. (Patient not taking: Reported on 06/04/2017), Disp: 16 g, Rfl: 11 .  meloxicam (MOBIC) 7.5 MG tablet, Take 1 tablet (7.5 mg total) by mouth daily. Only to be taken after completion of steroid pack., Disp: 30 tablet, Rfl: 0 .  methylPREDNISolone (MEDROL DOSEPAK) 4 MG TBPK tablet, 6 Day Taper Pack. Take as Directed., Disp: 21 tablet, Rfl: 0  No Known Allergies Review of Systems: Negative except as noted in the HPI. Denies N/V/F/Ch. Objective:  There were no vitals filed for this visit. General AA&O x3. Normal mood and affect.  Vascular Dorsalis pedis and posterior tibial pulses  present 2+ bilaterally  Capillary refill normal to all digits. Pedal hair growth normal.  Neurologic Epicritic sensation grossly present.  Dermatologic No open lesions. Interspaces clear of maceration. Nails well groomed and normal in appearance.  Orthopedic: MMT 5/5 in dorsiflexion, plantarflexion, inversion, and eversion. Normal joint ROM without pain or crepitus. Local warmth left first MPJ.  Pain on range of motion left first MPJ   Assessment & Plan:  Patient was evaluated and treated and all questions  answered.  Gout hallux limitus left first MPJ -Taken reviewed no acute fractures dislocations.  Early first MPJ arthritic changes. -Injection delivered first MPJ as below the left foot -Rx Medrol and meloxicam.  Only take meloxicam after completion of Medrol -Order for uric acid  Procedure: Joint Injection Location: Left 1st MPJ joint Skin Prep: Alcohol. Injectate: 0.5 cc 1% lidocaine plain, 0.5 cc dexamethasone phosphate. Disposition: Patient tolerated procedure well. Injection site dressed with a band-aid.    Return in about 3 weeks (around 01/31/2018) for Gout, Hllux Limitus F/u.

## 2018-01-15 ENCOUNTER — Telehealth: Payer: Self-pay | Admitting: Podiatry

## 2018-01-15 NOTE — Telephone Encounter (Signed)
Left message informing pt, I would inform Dr. March Rummage the lab results were available for review and if he wanted information concerning the data breach of [facility] he could call [phone].

## 2018-01-15 NOTE — Telephone Encounter (Signed)
Good morning. I saw Dr. March Rummage last Friday 31 May and you also ordered a blood test that I did over at [facility]. I was calling to see if I can get a report on that blood test and also see if I can maybe get some information about the data breach with [facility]. My phone number is [phone]. Thank you.

## 2018-01-16 NOTE — Telephone Encounter (Signed)
Can you inform patient and we will go over at his next visit.

## 2018-01-16 NOTE — Telephone Encounter (Signed)
Left message informing pt of Dr. March Rummage statement he will go over labs at pt's next visit.

## 2018-01-19 ENCOUNTER — Encounter: Payer: Self-pay | Admitting: Podiatry

## 2018-01-30 ENCOUNTER — Ambulatory Visit: Payer: Medicare HMO | Admitting: Podiatry

## 2018-01-30 DIAGNOSIS — M205X2 Other deformities of toe(s) (acquired), left foot: Secondary | ICD-10-CM | POA: Diagnosis not present

## 2018-01-30 DIAGNOSIS — M109 Gout, unspecified: Secondary | ICD-10-CM

## 2018-01-31 ENCOUNTER — Ambulatory Visit: Payer: Medicare HMO | Admitting: Podiatry

## 2018-02-02 NOTE — Progress Notes (Signed)
  Subjective:  Patient ID: Kenneth York, male    DOB: 11/04/1950,  MRN: [id number]  No chief complaint on file.  67 y.o. male presents with the above complaint.  States the pain is completely resolved. Past Medical History:  Diagnosis Date  . Allergy   . Cancer ([place])    basal cell   . Scoliosis of thoracic spine    Past Surgical History:  Procedure Laterality Date  . MOHS SURGERY  2004    Current Outpatient Medications:  .  aspirin 81 MG tablet, Take 81 mg by mouth daily., Disp: , Rfl:  .  cetirizine (ZYRTEC) 10 MG tablet, Take 10 mg by mouth daily., Disp: , Rfl:  .  fluticasone (FLONASE) 50 MCG/ACT nasal spray, Place 2 sprays into the nose daily. (Patient not taking: Reported on 06/04/2017), Disp: 16 g, Rfl: 11 .  meloxicam (MOBIC) 7.5 MG tablet, Take 1 tablet (7.5 mg total) by mouth daily. Only to be taken after completion of steroid pack., Disp: 30 tablet, Rfl: 0 .  methylPREDNISolone (MEDROL DOSEPAK) 4 MG TBPK tablet, 6 Day Taper Pack. Take as Directed., Disp: 21 tablet, Rfl: 0  No Known Allergies Review of Systems: Negative except as noted in the HPI. Denies N/V/F/Ch. Objective:  There were no vitals filed for this visit. General AA&O x3. Normal mood and affect.  Vascular Dorsalis pedis and posterior tibial pulses  present 2+ bilaterally  Capillary refill normal to all digits. Pedal hair growth normal.  Neurologic Epicritic sensation grossly present.  Dermatologic No open lesions. Interspaces clear of maceration. Nails well groomed and normal in appearance.  Orthopedic: MMT 5/5 in dorsiflexion, plantarflexion, inversion, and eversion. Normal joint ROM without pain or crepitus. No warmth or pain of left first MPJ   Assessment & Plan:  Patient was evaluated and treated and all questions answered.  Gout hallux limitus left first MPJ -Resolved. -Reviewed uric acid level patient advised PCP follow-up.  Likely that he does not have gout more just painful hallux  limitus -Follow-up as needed for further injection therapy   Return if symptoms worsen or fail to improve.

## 2018-02-26 ENCOUNTER — Other Ambulatory Visit: Payer: Self-pay | Admitting: Podiatry

## 2018-02-26 DIAGNOSIS — M205X2 Other deformities of toe(s) (acquired), left foot: Secondary | ICD-10-CM

## 2018-06-03 DIAGNOSIS — Z23 Encounter for immunization: Secondary | ICD-10-CM | POA: Diagnosis not present

## 2018-06-16 DIAGNOSIS — H2513 Age-related nuclear cataract, bilateral: Secondary | ICD-10-CM | POA: Diagnosis not present

## 2018-06-16 DIAGNOSIS — H40013 Open angle with borderline findings, low risk, bilateral: Secondary | ICD-10-CM | POA: Diagnosis not present

## 2018-06-16 DIAGNOSIS — D22121 Melanocytic nevi of left upper eyelid, including canthus: Secondary | ICD-10-CM | POA: Diagnosis not present

## 2018-06-16 DIAGNOSIS — H11152 Pinguecula, left eye: Secondary | ICD-10-CM | POA: Diagnosis not present

## 2018-07-08 ENCOUNTER — Encounter: Payer: Medicare HMO | Admitting: Adult Health

## 2018-07-17 ENCOUNTER — Encounter: Payer: Medicare HMO | Admitting: Adult Health

## 2018-08-21 ENCOUNTER — Encounter: Payer: Self-pay | Admitting: Adult Health

## 2018-08-21 ENCOUNTER — Ambulatory Visit (INDEPENDENT_AMBULATORY_CARE_PROVIDER_SITE_OTHER): Payer: Medicare HMO | Admitting: Adult Health

## 2018-08-21 VITALS — BP 124/80 | Temp 97.6°F | Ht 70.0 in | Wt 173.0 lb

## 2018-08-21 DIAGNOSIS — Z0000 Encounter for general adult medical examination without abnormal findings: Secondary | ICD-10-CM

## 2018-08-21 DIAGNOSIS — Z125 Encounter for screening for malignant neoplasm of prostate: Secondary | ICD-10-CM

## 2018-08-21 DIAGNOSIS — J301 Allergic rhinitis due to pollen: Secondary | ICD-10-CM | POA: Diagnosis not present

## 2018-08-21 DIAGNOSIS — Z23 Encounter for immunization: Secondary | ICD-10-CM

## 2018-08-21 LAB — POCT URINALYSIS DIPSTICK
Bilirubin, UA: NEGATIVE
Blood, UA: NEGATIVE
Glucose, UA: NEGATIVE
Ketones, UA: NEGATIVE
LEUKOCYTES UA: NEGATIVE
NITRITE UA: NEGATIVE
ODOR: NEGATIVE
PROTEIN UA: NEGATIVE
Spec Grav, UA: 1.015 (ref 1.010–1.025)
Urobilinogen, UA: 0.2 E.U./dL
pH, UA: 6 (ref 5.0–8.0)

## 2018-08-21 LAB — CBC WITH DIFFERENTIAL/PLATELET
BASOS PCT: 0.3 % (ref 0.0–3.0)
Basophils Absolute: 0 10*3/uL (ref 0.0–0.1)
EOS PCT: 1 % (ref 0.0–5.0)
Eosinophils Absolute: 0.1 10*3/uL (ref 0.0–0.7)
HCT: 45.1 % (ref 39.0–52.0)
HEMOGLOBIN: 15 g/dL (ref 13.0–17.0)
LYMPHS ABS: 1.6 10*3/uL (ref 0.7–4.0)
Lymphocytes Relative: 21.9 % (ref 12.0–46.0)
MCHC: 33.2 g/dL (ref 30.0–36.0)
MCV: 93.3 fl (ref 78.0–100.0)
MONO ABS: 0.8 10*3/uL (ref 0.1–1.0)
Monocytes Relative: 11.4 % (ref 3.0–12.0)
NEUTROS ABS: 4.7 10*3/uL (ref 1.4–7.7)
NEUTROS PCT: 65.4 % (ref 43.0–77.0)
Platelets: 187 10*3/uL (ref 150.0–400.0)
RBC: 4.83 Mil/uL (ref 4.22–5.81)
RDW: 13.3 % (ref 11.5–15.5)
WBC: 7.2 10*3/uL (ref 4.0–10.5)

## 2018-08-21 LAB — COMPREHENSIVE METABOLIC PANEL
ALBUMIN: 4.3 g/dL (ref 3.5–5.2)
ALT: 13 U/L (ref 0–53)
AST: 18 U/L (ref 0–37)
Alkaline Phosphatase: 40 U/L (ref 39–117)
BUN: 14 mg/dL (ref 6–23)
CHLORIDE: 101 meq/L (ref 96–112)
CO2: 30 mEq/L (ref 19–32)
Calcium: 9.6 mg/dL (ref 8.4–10.5)
Creatinine, Ser: 1.02 mg/dL (ref 0.40–1.50)
GFR: 77.35 mL/min (ref >60.00)
Glucose, Bld: 83 mg/dL (ref 70–99)
POTASSIUM: 4.6 meq/L (ref 3.5–5.1)
SODIUM: 138 meq/L (ref 135–145)
Total Bilirubin: 0.9 mg/dL (ref 0.2–1.2)
Total Protein: 6.5 g/dL (ref 6.0–8.3)

## 2018-08-21 LAB — LIPID PANEL
CHOLESTEROL: 207 mg/dL — AB (ref 0–200)
HDL: 69.7 mg/dL (ref >39.00)
LDL CALC: 126 mg/dL — AB (ref 0–99)
NonHDL: 137.76
Total CHOL/HDL Ratio: 3
Triglycerides: 60 mg/dL (ref 0.0–149.0)
VLDL: 12 mg/dL (ref 0.0–40.0)

## 2018-08-21 LAB — PSA: PSA: 2.24 ng/mL (ref 0.10–4.00)

## 2018-08-21 LAB — TSH: TSH: 0.99 u[IU]/mL (ref 0.35–4.50)

## 2018-08-21 NOTE — Progress Notes (Signed)
Subjective:    Patient ID: Kenneth York, male    DOB: 04/22/1951, 68 y.o.   MRN: [id number]  HPI Patient presents for yearly preventative medicine examination. He is a pleasant 68 year old male who  has a past medical history of Allergy, Cancer ([place]), and Scoliosis of thoracic spine.  Seasonal Allergies - takes OTC medication   H/o Basal Cell Carcinoma- is followed by Dermatology   All immunizations and health maintenance protocols were reviewed with the patient and needed orders were placed. Due for flu vaccination and pneumovax 23  Appropriate screening laboratory values were ordered for the patient including screening of hyperlipidemia, renal function and hepatic function. If indicated by BPH, a PSA was ordered.  Medication reconciliation,  past medical history, social history, problem list and allergies were reviewed in detail with the patient  Goals were established with regard to weight loss, exercise, and  diet in compliance with medications. He continues to exercise and eat healthy.   Wt Readings from Last 3 Encounters:  08/21/18 173 lb (78.5 kg)  06/04/17 171 lb (77.6 kg)  02/20/17 173 lb 3.2 oz (78.6 kg)   He has no acute complaints   Review of Systems  Constitutional: Negative.   HENT: Negative.   Eyes: Negative.   Respiratory: Negative.   Cardiovascular: Negative.   Gastrointestinal: Negative.   Endocrine: Negative.   Genitourinary: Negative.   Musculoskeletal: Negative.   Skin: Negative.   Allergic/Immunologic: Negative.   Neurological: Negative.   Hematological: Negative.   Psychiatric/Behavioral: Negative.   All other systems reviewed and are negative.  Past Medical History:  Diagnosis Date  . Allergy   . Cancer ([place])    basal cell   . Scoliosis of thoracic spine     Social History   Socioeconomic History  . Marital status: Married    Spouse name: Not on file  . Number of children: Not on file  . Years of education: Not on file  .  Highest education level: Not on file  Occupational History  . Not on file  Social Needs  . Financial resource strain: Not on file  . Food insecurity:    Worry: Not on file    Inability: Not on file  . Transportation needs:    Medical: Not on file    Non-medical: Not on file  Tobacco Use  . Smoking status: Never Smoker  . Smokeless tobacco: Never Used  Substance and Sexual Activity  . Alcohol use: Yes    Alcohol/week: 4.0 standard drinks    Types: 4 Cans of beer per week  . Drug use: No  . Sexual activity: Not on file  Lifestyle  . Physical activity:    Days per week: Not on file    Minutes per session: Not on file  . Stress: Not on file  Relationships  . Social connections:    Talks on phone: Not on file    Gets together: Not on file    Attends religious service: Not on file    Active member of club or organization: Not on file    Attends meetings of clubs or organizations: Not on file    Relationship status: Not on file  . Intimate partner violence:    Fear of current or ex partner: Not on file    Emotionally abused: Not on file    Physically abused: Not on file    Forced sexual activity: Not on file  Other Topics Concern  . 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 MOHS SURGERY  2004    Family History  Problem Relation Age of Onset  . Ovarian cancer Mother   . Heart disease Father   . Prostate cancer Father   . Colon cancer Neg Hx   . Esophageal cancer Neg Hx   . Rectal cancer Neg Hx   . Stomach cancer Neg Hx     No Known Allergies  Current Outpatient Medications on File Prior to Visit  Medication Sig Dispense Refill  . aspirin 81 MG tablet Take 81 mg by mouth daily.     No current facility-administered medications on file prior to visit.     BP 124/80   Temp 97.6 F (36.4 C)   Ht 5'  10" (1.778 m)   Wt 173 lb (78.5 kg)   BMI 24.82 kg/m       Objective:   Physical Exam Vitals signs and nursing note reviewed.  Constitutional:      General: He is not in acute distress.    Appearance: Normal appearance. He is well-developed and normal weight. He is not diaphoretic.  HENT:     Head: Normocephalic and atraumatic.     Right Ear: Tympanic membrane, ear canal and external ear normal. There is no impacted cerumen.     Left Ear: Tympanic membrane, ear canal and external ear normal. There is no impacted cerumen.     Nose: Nose normal. No congestion or rhinorrhea.     Mouth/Throat:     Mouth: Mucous membranes are moist.     Pharynx: Oropharynx is clear. No oropharyngeal exudate.  Eyes:     General:        Right eye: No discharge.        Left eye: No discharge.     Conjunctiva/sclera: Conjunctivae normal.     Pupils: Pupils are equal, round, and reactive to light.  Neck:     Thyroid: No thyromegaly.     Trachea: No tracheal deviation.  Cardiovascular:     Rate and Rhythm: Normal rate and regular rhythm.     Heart sounds: Normal heart sounds. No murmur. No friction rub. No gallop.   Pulmonary:     Effort: Pulmonary effort is normal. No respiratory distress.     Breath sounds: Normal breath sounds. No stridor. No wheezing, rhonchi or rales.  Chest:     Chest wall: No tenderness.  Abdominal:     General: Bowel sounds are normal. There is no distension.     Palpations: Abdomen is soft. There is no mass.     Tenderness: There is no abdominal tenderness. There is no right CVA tenderness, left CVA tenderness, guarding or rebound.     Hernia: No hernia is present.  Genitourinary:    Penis: Normal.      Scrotum/Testes: Normal.  Musculoskeletal: Normal range of motion.  Lymphadenopathy:     Cervical: No cervical adenopathy.  Skin:    General: Skin is warm and dry.     Coloration: Skin is not jaundiced or pale.     Findings: No bruising, erythema, lesion or rash.    Neurological:     Mental Status: He is alert and oriented to person, place, and time.     Cranial Nerves: No cranial  nerve deficit.     Coordination: Coordination normal.  Psychiatric:        Mood and Affect: Mood normal.        Behavior: Behavior normal.        Thought Content: Thought content normal.        Judgment: Judgment normal.       Assessment & Plan:  1. Routine general medical examination at a health care facility - Benign exam  - Follow up in one year or sooner if needed - CBC with Differential/Platelet - Comprehensive metabolic panel - Lipid panel - PSA - TSH - POC Urinalysis Dipstick  2. Seasonal allergic rhinitis due to pollen - Continue with OTC medications   3. Prostate cancer screening  - PSA  4. Need for vaccination against Streptococcus pneumoniae  - Pneumococcal polysaccharide vaccine 23-valent greater than or equal to 2yo subcutaneous/IM  [employer]

## 2018-08-21 NOTE — Patient Instructions (Signed)
It was great seeing you today!  Your exam was completely normal.   We will follow up with you regarding your blood work   Follow up in one year or sooner if needed

## 2018-09-29 ENCOUNTER — Other Ambulatory Visit: Payer: Self-pay | Admitting: Dermatology

## 2018-09-29 DIAGNOSIS — L570 Actinic keratosis: Secondary | ICD-10-CM | POA: Diagnosis not present

## 2018-09-29 DIAGNOSIS — R69 Illness, unspecified: Secondary | ICD-10-CM | POA: Diagnosis not present

## 2018-09-29 DIAGNOSIS — L821 Other seborrheic keratosis: Secondary | ICD-10-CM | POA: Diagnosis not present

## 2018-09-29 DIAGNOSIS — D229 Melanocytic nevi, unspecified: Secondary | ICD-10-CM | POA: Diagnosis not present

## 2018-09-29 DIAGNOSIS — D485 Neoplasm of uncertain behavior of skin: Secondary | ICD-10-CM | POA: Diagnosis not present

## 2019-02-09 DIAGNOSIS — M4155 Other secondary scoliosis, thoracolumbar region: Secondary | ICD-10-CM | POA: Diagnosis not present

## 2019-02-09 DIAGNOSIS — M5126 Other intervertebral disc displacement, lumbar region: Secondary | ICD-10-CM | POA: Diagnosis not present

## 2019-02-09 DIAGNOSIS — M5442 Lumbago with sciatica, left side: Secondary | ICD-10-CM | POA: Diagnosis not present

## 2019-02-09 DIAGNOSIS — M545 Low back pain: Secondary | ICD-10-CM | POA: Diagnosis not present

## 2019-02-19 DIAGNOSIS — M5126 Other intervertebral disc displacement, lumbar region: Secondary | ICD-10-CM | POA: Diagnosis not present

## 2019-02-19 DIAGNOSIS — M4155 Other secondary scoliosis, thoracolumbar region: Secondary | ICD-10-CM | POA: Diagnosis not present

## 2019-02-19 DIAGNOSIS — M5442 Lumbago with sciatica, left side: Secondary | ICD-10-CM | POA: Diagnosis not present

## 2019-02-19 DIAGNOSIS — M545 Low back pain: Secondary | ICD-10-CM | POA: Diagnosis not present

## 2019-02-24 DIAGNOSIS — M545 Low back pain: Secondary | ICD-10-CM | POA: Diagnosis not present

## 2019-02-24 DIAGNOSIS — M4155 Other secondary scoliosis, thoracolumbar region: Secondary | ICD-10-CM | POA: Diagnosis not present

## 2019-02-24 DIAGNOSIS — M5126 Other intervertebral disc displacement, lumbar region: Secondary | ICD-10-CM | POA: Diagnosis not present

## 2019-02-24 DIAGNOSIS — M5442 Lumbago with sciatica, left side: Secondary | ICD-10-CM | POA: Diagnosis not present

## 2019-03-04 DIAGNOSIS — M545 Low back pain: Secondary | ICD-10-CM | POA: Diagnosis not present

## 2019-03-04 DIAGNOSIS — M5126 Other intervertebral disc displacement, lumbar region: Secondary | ICD-10-CM | POA: Diagnosis not present

## 2019-03-04 DIAGNOSIS — M5442 Lumbago with sciatica, left side: Secondary | ICD-10-CM | POA: Diagnosis not present

## 2019-03-04 DIAGNOSIS — M4155 Other secondary scoliosis, thoracolumbar region: Secondary | ICD-10-CM | POA: Diagnosis not present

## 2019-03-26 DIAGNOSIS — M5126 Other intervertebral disc displacement, lumbar region: Secondary | ICD-10-CM | POA: Diagnosis not present

## 2019-03-26 DIAGNOSIS — M545 Low back pain: Secondary | ICD-10-CM | POA: Diagnosis not present

## 2019-03-26 DIAGNOSIS — M4155 Other secondary scoliosis, thoracolumbar region: Secondary | ICD-10-CM | POA: Diagnosis not present

## 2019-03-26 DIAGNOSIS — M5442 Lumbago with sciatica, left side: Secondary | ICD-10-CM | POA: Diagnosis not present

## 2019-06-10 DIAGNOSIS — R69 Illness, unspecified: Secondary | ICD-10-CM | POA: Diagnosis not present

## 2019-06-11 DIAGNOSIS — Z23 Encounter for immunization: Secondary | ICD-10-CM | POA: Diagnosis not present

## 2019-06-17 DIAGNOSIS — D492 Neoplasm of unspecified behavior of bone, soft tissue, and skin: Secondary | ICD-10-CM | POA: Diagnosis not present

## 2019-06-17 DIAGNOSIS — D487 Neoplasm of uncertain behavior of other specified sites: Secondary | ICD-10-CM | POA: Diagnosis not present

## 2019-06-17 DIAGNOSIS — H2513 Age-related nuclear cataract, bilateral: Secondary | ICD-10-CM | POA: Diagnosis not present

## 2019-06-17 DIAGNOSIS — H11152 Pinguecula, left eye: Secondary | ICD-10-CM | POA: Diagnosis not present

## 2019-06-17 DIAGNOSIS — H40013 Open angle with borderline findings, low risk, bilateral: Secondary | ICD-10-CM | POA: Diagnosis not present

## 2019-07-22 ENCOUNTER — Other Ambulatory Visit: Payer: Self-pay

## 2019-07-22 ENCOUNTER — Telehealth (INDEPENDENT_AMBULATORY_CARE_PROVIDER_SITE_OTHER): Payer: Medicare HMO | Admitting: Adult Health

## 2019-07-22 DIAGNOSIS — J069 Acute upper respiratory infection, unspecified: Secondary | ICD-10-CM | POA: Diagnosis not present

## 2019-07-22 DIAGNOSIS — R0781 Pleurodynia: Secondary | ICD-10-CM

## 2019-07-22 MED ORDER — DOXYCYCLINE HYCLATE 100 MG PO CAPS: 100.0000 mg | ORAL_CAPSULE | Freq: Two times a day (BID) | ORAL | 0 refills | Status: DC

## 2019-07-22 NOTE — Progress Notes (Signed)
Virtual Visit via Video Note  I connected with Kenneth York on 07/22/19 at 11:00 AM EST by a video enabled telemedicine application and verified that I am speaking with the correct person using two identifiers.  Location patient: home Location provider:work or home office Persons participating in the virtual visit: patient, provider  I discussed the limitations of evaluation and management by telemedicine and the availability of in person appointments. The patient expressed understanding and agreed to proceed.   HPI: 68 year old male who is being evaluated today for an acute issue.  He reports that for the last 2 to 3 weeks he has been experiencing chest congestion, productive cough, sinus pain and pressure, and rhinorrhea.  He denies fevers, chills, body aches, shortness of breath.  Reports that this usually happens about this time on a yearly basis.  He has not experienced loss of taste or smell.  He has had no known Covid exposure, but works at [employer] part-time.  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  Reports that he had some right-sided rib pain, pain is worse when taking a deep breath.  Pain is intermittent and mild   ROS: See pertinent positives and negatives per HPI.  Past Medical History:  Diagnosis Date  . Allergy   . Cancer ([place])    basal cell   . Scoliosis of thoracic spine     Past Surgical History:  Procedure Laterality Date  . MOHS SURGERY  2004    Family History  Problem Relation Age of Onset  . Ovarian cancer Mother   . Heart disease Father   . Prostate cancer Father   . Colon cancer Neg Hx   . Esophageal cancer Neg Hx   . Rectal cancer Neg Hx   . Stomach cancer Neg Hx      Current Outpatient Medications:  .  aspirin 81 MG tablet, Take 81 mg by mouth daily.,  Disp: , Rfl:   EXAM:  VITALS per patient if applicable:  GENERAL: alert, oriented, appears well and in no acute distress  HEENT: atraumatic, conjunttiva clear, no obvious abnormalities on inspection of external nose and ears  NECK: normal movements of the head and neck  LUNGS: on inspection no signs of respiratory distress, breathing rate appears normal, no obvious gross SOB, gasping or wheezing  CV: no obvious cyanosis  MS: moves all visible extremities without noticeable abnormality  PSYCH/NEURO: pleasant and cooperative, no obvious depression or anxiety, speech and thought processing grossly intact  ASSESSMENT AND PLAN:  Discussed the following assessment and plan:  1. Upper respiratory tract infection, unspecified type -We will treat due to duration.  Follow-up if not resolved by the end of antibiotic treatment - doxycycline (VIBRAMYCIN) 100 MG capsule; Take 1 capsule (100 mg total) by mouth 2 (two) times daily.  Dispense: 14 capsule; Refill: 0  2. Rib pain -Likely bruised the bone or muscle.  Doubt fractured rib.  Can use over-the-counter anti-inflammatories.  Follow-up if no improvement in the next 2-3 days and will consider chest xray at that time      I discussed the assessment and treatment plan with the patient. The patient was provided an opportunity to ask questions and all were answered. The patient agreed with the plan and demonstrated an understanding of the instructions.   The patient was  advised to call back or seek an in-person evaluation if the symptoms worsen or if the condition fails to improve as anticipated.   Dorothyann Peng, NP

## 2019-09-13 ENCOUNTER — Ambulatory Visit: Payer: Medicare HMO

## 2019-09-19 ENCOUNTER — Ambulatory Visit: Payer: Medicare HMO

## 2019-09-28 DIAGNOSIS — R69 Illness, unspecified: Secondary | ICD-10-CM | POA: Diagnosis not present

## 2019-09-29 DIAGNOSIS — R69 Illness, unspecified: Secondary | ICD-10-CM | POA: Diagnosis not present

## 2019-10-04 ENCOUNTER — Ambulatory Visit: Payer: Medicare HMO

## 2020-03-24 DIAGNOSIS — M5126 Other intervertebral disc displacement, lumbar region: Secondary | ICD-10-CM | POA: Diagnosis not present

## 2020-03-24 DIAGNOSIS — M5442 Lumbago with sciatica, left side: Secondary | ICD-10-CM | POA: Diagnosis not present

## 2020-03-24 DIAGNOSIS — M545 Low back pain: Secondary | ICD-10-CM | POA: Diagnosis not present

## 2020-03-24 DIAGNOSIS — M4155 Other secondary scoliosis, thoracolumbar region: Secondary | ICD-10-CM | POA: Diagnosis not present

## 2020-03-28 DIAGNOSIS — M5126 Other intervertebral disc displacement, lumbar region: Secondary | ICD-10-CM | POA: Diagnosis not present

## 2020-03-28 DIAGNOSIS — M545 Low back pain: Secondary | ICD-10-CM | POA: Diagnosis not present

## 2020-03-28 DIAGNOSIS — M4155 Other secondary scoliosis, thoracolumbar region: Secondary | ICD-10-CM | POA: Diagnosis not present

## 2020-03-28 DIAGNOSIS — M5442 Lumbago with sciatica, left side: Secondary | ICD-10-CM | POA: Diagnosis not present

## 2020-03-31 DIAGNOSIS — M5126 Other intervertebral disc displacement, lumbar region: Secondary | ICD-10-CM | POA: Diagnosis not present

## 2020-03-31 DIAGNOSIS — M4155 Other secondary scoliosis, thoracolumbar region: Secondary | ICD-10-CM | POA: Diagnosis not present

## 2020-03-31 DIAGNOSIS — M5442 Lumbago with sciatica, left side: Secondary | ICD-10-CM | POA: Diagnosis not present

## 2020-03-31 DIAGNOSIS — M545 Low back pain: Secondary | ICD-10-CM | POA: Diagnosis not present

## 2020-04-12 DIAGNOSIS — M545 Low back pain: Secondary | ICD-10-CM | POA: Diagnosis not present

## 2020-04-12 DIAGNOSIS — M5126 Other intervertebral disc displacement, lumbar region: Secondary | ICD-10-CM | POA: Diagnosis not present

## 2020-04-12 DIAGNOSIS — M5442 Lumbago with sciatica, left side: Secondary | ICD-10-CM | POA: Diagnosis not present

## 2020-04-12 DIAGNOSIS — M4155 Other secondary scoliosis, thoracolumbar region: Secondary | ICD-10-CM | POA: Diagnosis not present

## 2020-04-23 DIAGNOSIS — M4155 Other secondary scoliosis, thoracolumbar region: Secondary | ICD-10-CM | POA: Diagnosis not present

## 2020-04-23 DIAGNOSIS — M545 Low back pain: Secondary | ICD-10-CM | POA: Diagnosis not present

## 2020-04-23 DIAGNOSIS — M5126 Other intervertebral disc displacement, lumbar region: Secondary | ICD-10-CM | POA: Diagnosis not present

## 2020-04-23 DIAGNOSIS — M5442 Lumbago with sciatica, left side: Secondary | ICD-10-CM | POA: Diagnosis not present

## 2020-05-05 DIAGNOSIS — M5126 Other intervertebral disc displacement, lumbar region: Secondary | ICD-10-CM | POA: Diagnosis not present

## 2020-05-05 DIAGNOSIS — M5442 Lumbago with sciatica, left side: Secondary | ICD-10-CM | POA: Diagnosis not present

## 2020-05-05 DIAGNOSIS — M4155 Other secondary scoliosis, thoracolumbar region: Secondary | ICD-10-CM | POA: Diagnosis not present

## 2020-05-05 DIAGNOSIS — M545 Low back pain: Secondary | ICD-10-CM | POA: Diagnosis not present

## 2020-05-18 ENCOUNTER — Other Ambulatory Visit: Payer: Self-pay

## 2020-05-18 ENCOUNTER — Encounter: Payer: Self-pay | Admitting: Dermatology

## 2020-05-18 ENCOUNTER — Ambulatory Visit: Payer: Medicare HMO | Admitting: Dermatology

## 2020-05-18 DIAGNOSIS — D1801 Hemangioma of skin and subcutaneous tissue: Secondary | ICD-10-CM

## 2020-05-18 DIAGNOSIS — D3612 Benign neoplasm of peripheral nerves and autonomic nervous system, upper limb, including shoulder: Secondary | ICD-10-CM

## 2020-05-18 DIAGNOSIS — L814 Other melanin hyperpigmentation: Secondary | ICD-10-CM | POA: Diagnosis not present

## 2020-05-18 DIAGNOSIS — Z1283 Encounter for screening for malignant neoplasm of skin: Secondary | ICD-10-CM | POA: Diagnosis not present

## 2020-05-18 DIAGNOSIS — L570 Actinic keratosis: Secondary | ICD-10-CM

## 2020-05-18 DIAGNOSIS — L918 Other hypertrophic disorders of the skin: Secondary | ICD-10-CM | POA: Diagnosis not present

## 2020-05-18 NOTE — Patient Instructions (Addendum)
First follow-up in years for Kenneth York date of birth March 01, 1951.  General skin examination from legs to top of scalp showed no atypical moles, no melanoma, no nonmole skin cancer.  On each antihelix was a subtle 6 mm crust that represents solar keratoses (precancers).  These were treated with 4 to 5-second liquid nitrogen freeze and you could expect these to swell and perhaps peel in the next 2 weeks.  There is no special care.  I think you are doing an excellent job of fitness and and general sun protection.  The benign nonmole growths include some tiny little red dots on the torso called angiomas, soft 3 mm pink spot in front of the right underarm called a solitary neurofibroma, a brown 1 mm skin tag that you pointed out to me in the left axilla, some brown freckles on the top of the hands called solar lentigines, some flat subtly textured tan spots on the left wrist and the right shin and behind the knee called flat keratoses.  None of these require special care.  Please twice annually self examine your skin with your spouse looking at your back and if there is any significant change I be delighted to see you.  Otherwise routine general skin examination in 1 to 2-year intervals.

## 2020-05-18 NOTE — Progress Notes (Signed)
LN2 right+lef  New Patient   Subjective  Kenneth York is a 69 y.o. male who presents for the following: Annual Exam (ears get crusty and crusty).  Crusts Location: Ears Duration:  Quality:  Associated Signs/Symptoms: Modifying Factors:  Severity:  Timing: Context: Would like complete skin examination   The following portions of the chart were reviewed this encounter and updated as appropriate:   Objective  Well appearing patient in no apparent distress; mood and affect are within normal limits.  A complete.  Skin examination from legs did have a scalp was performed   Assessment & Plan  AK (actinic keratosis) (2) Left Triangular Fossa; Right Cymba  Destruction of lesion - Left Triangular Fossa, Right Cymba Complexity: simple   Destruction method: cryotherapy   Informed consent: discussed and consent obtained   Lesion destroyed using liquid nitrogen: Yes   Cryotherapy cycles:  5 Outcome: patient tolerated procedure well with no complications    First follow-up in years for Kenneth York Payment date of birth 01/23/1951.  General skin examination from legs to top of scalp showed no atypical moles, no melanoma, no nonmole skin cancer.  On each antihelix was a subtle 6 mm crust that represents solar keratoses (precancers).  These were treated with 4 to 5-second liquid nitrogen freeze and you could expect these to swell and perhaps peel in the next 2 weeks.  There is no special care.  I think you are doing an excellent job of fitness and and general sun protection.  The benign nonmole growths include some tiny little red dots on the torso called angiomas, soft 3 mm pink spot in front of the right underarm called a solitary neurofibroma, a brown 1 mm skin tag that you pointed out to me in the left axilla, some brown freckles on the top of the hands called solar lentigines, some flat subtly textured tan spots on the left wrist and the right shin and behind the knee called  flat keratoses.  None of these require special care.  Please twice annually self examine your skin with your spouse looking at your back and if there is any significant change I be delighted to see you.  Otherwise routine general skin examination in 1 to 2-year intervals. Skin cancer screening performed today.

## 2020-05-20 ENCOUNTER — Ambulatory Visit (INDEPENDENT_AMBULATORY_CARE_PROVIDER_SITE_OTHER): Payer: Medicare HMO | Admitting: Adult Health

## 2020-05-20 ENCOUNTER — Other Ambulatory Visit: Payer: Self-pay

## 2020-05-20 ENCOUNTER — Encounter: Payer: Self-pay | Admitting: Adult Health

## 2020-05-20 VITALS — BP 128/68 | HR 61 | Temp 97.9°F | Ht 71.5 in | Wt 174.0 lb

## 2020-05-20 DIAGNOSIS — Z0184 Encounter for antibody response examination: Secondary | ICD-10-CM

## 2020-05-20 DIAGNOSIS — Z0000 Encounter for general adult medical examination without abnormal findings: Secondary | ICD-10-CM

## 2020-05-20 DIAGNOSIS — Z125 Encounter for screening for malignant neoplasm of prostate: Secondary | ICD-10-CM | POA: Diagnosis not present

## 2020-05-20 DIAGNOSIS — Z85828 Personal history of other malignant neoplasm of skin: Secondary | ICD-10-CM

## 2020-05-20 DIAGNOSIS — J301 Allergic rhinitis due to pollen: Secondary | ICD-10-CM

## 2020-05-20 NOTE — Progress Notes (Signed)
Subjective:    Patient ID: Kenneth York, male    DOB: [birth year], 69 y.o.   MRN: [id number]  HPI Patient presents for yearly preventative medicine examination. He is a pleasant 69 year old male who  has a past medical history of Allergy, Cancer ([place]), and Scoliosis of thoracic spine.  Seasonal Allergies - takes OTC medication as needed  H/O Basal Cell Carcinoma - is followed by Dermatology yearly.   He would like covid antibody testing done to see his immune status   All immunizations and health maintenance protocols were reviewed with the patient and needed orders were placed.  Appropriate screening laboratory values were ordered for the patient including screening of hyperlipidemia, renal function and hepatic function. If indicated by BPH, a PSA was ordered.  Medication reconciliation,  past medical history, social history, problem list and allergies were reviewed in detail with the patient  Goals were established with regard to weight loss, exercise, and  diet in compliance with medications. He continues to exercise and eats healthy.  Wt Readings from Last 3 Encounters:  08/21/18 173 lb (78.5 kg)  06/04/17 171 lb (77.6 kg)  02/20/17 173 lb 3.2 oz (78.6 kg)   He is up-to-date on routine colon cancer screening and dental screens.    Review of Systems  Constitutional: Negative.   HENT: Negative.   Eyes: Negative.   Respiratory: Negative.   Cardiovascular: Negative.   Gastrointestinal: Negative.   Endocrine: Negative.   Genitourinary: Negative.   Musculoskeletal: Negative.   Skin: Negative.   Allergic/Immunologic: Negative.   Neurological: Negative.   Hematological: Negative.   Psychiatric/Behavioral: Negative.   All other systems reviewed and are negative.  Past Medical History:  Diagnosis Date  . Allergy   . Cancer ([place])    basal cell   . Scoliosis of thoracic spine     Social History   Socioeconomic History  . Marital status: Married    Spouse name:  Not on file  . Number of children: Not on file  . Years of education: Not on file  . Highest education level: Not on file  Occupational History  . Not on file  Tobacco Use  . Smoking status: Never Smoker  . Smokeless tobacco: Never Used  Substance and Sexual Activity  . Alcohol use: Yes    Alcohol/week: 4.0 standard drinks    Types: 4 Cans of beer per week  . Drug use: No  . Sexual activity: Not on file  Other Topics Concern  . 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 Difficulty of Paying Living Expenses: Not on file  Food Insecurity:   . Worried About Charity fundraiser in the Last Year: Not on file  . Ran Out of Food in the Last Year: Not on file  Transportation Needs:   . Lack of Transportation (Medical): Not on file  . Lack of Transportation (Non-Medical): Not on file  Physical Activity:   . Days of Exercise per Week: Not on file  . Minutes of Exercise per Session: Not on file  Stress:   . Feeling of Stress : Not on file  Social Connections:   . Frequency of Communication with Friends and Family: Not on file  . Frequency of Social Gatherings with  Friends and Family: Not on file  . Attends Religious Services: Not on file  . Active Member of Clubs or Organizations: Not on file  . Attends Archivist Meetings: Not on file  . Marital Status: Not on file  Intimate Partner Violence:   . Fear of Current or Ex-Partner: Not on file  . Emotionally Abused: Not on file  . Physically Abused: Not on file  . Sexually Abused: Not on file    Past Surgical History:  Procedure Laterality Date  . MOHS SURGERY  2004    Family History  Problem Relation Age of Onset  . Ovarian cancer Mother   . Heart disease Father   . Prostate cancer Father   . Colon cancer Neg Hx   . Esophageal cancer Neg Hx     . Rectal cancer Neg Hx   . Stomach cancer Neg Hx     No Known Allergies  Current Outpatient Medications on File Prior to Visit  Medication Sig Dispense Refill  . aspirin 81 MG tablet Take 81 mg by mouth daily.    Marland Kitchen doxycycline (VIBRAMYCIN) 100 MG capsule Take 1 capsule (100 mg total) by mouth 2 (two) times daily. (Patient not taking: Reported on 05/18/2020) 14 capsule 0  . zinc gluconate 50 MG tablet Take 50 mg by mouth daily.     No current facility-administered medications on file prior to visit.    There were no vitals taken for this visit.      Objective:   Physical Exam Vitals and nursing note reviewed.  Constitutional:      General: He is not in acute distress.    Appearance: Normal appearance. He is well-developed and normal weight.  HENT:     Head: Normocephalic and atraumatic.     Right Ear: Tympanic membrane, ear canal and external ear normal. There is no impacted cerumen.     Left Ear: Tympanic membrane, ear canal and external ear normal. There is no impacted cerumen.     Nose: Nose normal. No congestion or rhinorrhea.     Mouth/Throat:     Mouth: Mucous membranes are moist.     Pharynx: Oropharynx is clear. No oropharyngeal exudate or posterior oropharyngeal erythema.  Eyes:     General:        Right eye: No discharge.        Left eye: No discharge.     Extraocular Movements: Extraocular movements intact.     Conjunctiva/sclera: Conjunctivae normal.     Pupils: Pupils are equal, round, and reactive to light.  Neck:     Vascular: No carotid bruit.     Trachea: No tracheal deviation.  Cardiovascular:     Rate and Rhythm: Normal rate and regular rhythm.     Pulses: Normal pulses.     Heart sounds: Normal heart sounds. No murmur heard.  No friction rub. No gallop.   Pulmonary:     Effort: Pulmonary effort is normal. No respiratory distress.     Breath sounds: Normal breath sounds. No stridor. No wheezing, rhonchi or rales.  Chest:     Chest wall: No  tenderness.  Abdominal:     General: Bowel sounds are normal. There is no distension.     Palpations: Abdomen is soft. There is no mass.     Tenderness: There is no abdominal tenderness. There is no right CVA tenderness, left CVA tenderness, guarding or rebound.     Hernia: No hernia is present.  Musculoskeletal:  General: No swelling, tenderness, deformity or signs of injury. Normal range of motion.     Right lower leg: No edema.     Left lower leg: No edema.  Lymphadenopathy:     Cervical: No cervical adenopathy.  Skin:    General: Skin is warm and dry.     Capillary Refill: Capillary refill takes less than 2 seconds.     Coloration: Skin is not jaundiced or pale.     Findings: No bruising, erythema, lesion or rash.  Neurological:     General: No focal deficit present.     Mental Status: He is alert and oriented to person, place, and time.     Cranial Nerves: No cranial nerve deficit.     Sensory: No sensory deficit.     Motor: No weakness.     Coordination: Coordination normal.     Gait: Gait normal.     Deep Tendon Reflexes: Reflexes normal.  Psychiatric:        Mood and Affect: Mood normal.        Behavior: Behavior normal.        Thought Content: Thought content normal.        Judgment: Judgment normal.       Assessment & Plan:  1. Routine general medical examination at a health care facility  - CBC with Differential/Platelet; Future - Lipid panel; Future - TSH; Future - Hemoglobin A1c; Future - CMP with eGFR([facility]); Future  2. Seasonal allergic rhinitis due to pollen   3. Prostate cancer screening  - PSA; Future  4. History of basal cell carcinoma   5. COVID-19 virus IgM antibody test result unknown  - SARS CoV2 Serology(COVID19) AB(IgG,IgM),Immunoassay; Future  Dorothyann Peng, NP

## 2020-05-20 NOTE — Patient Instructions (Signed)
It as great seeing you today   We will follow up with you regarding your blood work   Please continue to stay active and eat health y

## 2020-05-23 DIAGNOSIS — Z23 Encounter for immunization: Secondary | ICD-10-CM | POA: Diagnosis not present

## 2020-05-23 LAB — LIPID PANEL
Cholesterol: 194 mg/dL (ref <200)
HDL: 73 mg/dL (ref >=40)
LDL Cholesterol (Calc): 106 mg/dL (calc) — ABNORMAL HIGH
Non-HDL Cholesterol (Calc): 121 mg/dL (calc) (ref <130)
Total CHOL/HDL Ratio: 2.7 (calc) (ref <5.0)
Triglycerides: 60 mg/dL (ref <150)

## 2020-05-23 LAB — CBC WITH DIFFERENTIAL/PLATELET
Absolute Monocytes: 868 cells/uL (ref 200–950)
Basophils Absolute: 31 cells/uL (ref 0–200)
Basophils Relative: 0.5 %
Eosinophils Absolute: 62 cells/uL (ref 15–500)
Eosinophils Relative: 1 %
HCT: 45.6 % (ref 38.5–50.0)
Hemoglobin: 15.5 g/dL (ref 13.2–17.1)
Lymphs Abs: 1674 cells/uL (ref 850–3900)
MCH: 31.5 pg (ref 27.0–33.0)
MCHC: 34 g/dL (ref 32.0–36.0)
MCV: 92.7 fL (ref 80.0–100.0)
MPV: 12.1 fL (ref 7.5–12.5)
Monocytes Relative: 14 %
Neutro Abs: 3565 cells/uL (ref 1500–7800)
Neutrophils Relative %: 57.5 %
Platelets: 194 10*3/uL (ref 140–400)
RBC: 4.92 10*6/uL (ref 4.20–5.80)
RDW: 12.4 % (ref 11.0–15.0)
Total Lymphocyte: 27 %
WBC: 6.2 10*3/uL (ref 3.8–10.8)

## 2020-05-23 LAB — PSA: PSA: 1.57 ng/mL (ref <=4.0)

## 2020-05-23 LAB — HEMOGLOBIN A1C
Hgb A1c MFr Bld: 5.5 % of total Hgb (ref <5.7)
Mean Plasma Glucose: 111 (calc)
eAG (mmol/L): 6.2 (calc)

## 2020-05-23 LAB — COMPLETE METABOLIC PANEL WITH GFR
AG Ratio: 1.9 (calc) (ref 1.0–2.5)
ALT: 11 U/L (ref 9–46)
AST: 18 U/L (ref 10–35)
Albumin: 4.3 g/dL (ref 3.6–5.1)
Alkaline phosphatase (APISO): 39 U/L (ref 35–144)
BUN: 15 mg/dL (ref 7–25)
CO2: 31 mmol/L (ref 20–32)
Calcium: 9.6 mg/dL (ref 8.6–10.3)
Chloride: 103 mmol/L (ref 98–110)
Creat: 1.09 mg/dL (ref 0.70–1.25)
GFR, Est African American: 80 mL/min/{1.73_m2} (ref >=60)
GFR, Est Non African American: 69 mL/min/{1.73_m2} (ref >=60)
Globulin: 2.3 g/dL (calc) (ref 1.9–3.7)
Glucose, Bld: 90 mg/dL (ref 65–99)
Potassium: 4.3 mmol/L (ref 3.5–5.3)
Sodium: 138 mmol/L (ref 135–146)
Total Bilirubin: 0.9 mg/dL (ref 0.2–1.2)
Total Protein: 6.6 g/dL (ref 6.1–8.1)

## 2020-05-23 LAB — TSH: TSH: 0.95 mIU/L (ref 0.40–4.50)

## 2020-05-23 LAB — SARS COV-2 SEROLOGY(COVID-19)AB(IGG,IGM),IMMUNOASSAY
SARS CoV-2 AB IgG: NEGATIVE
SARS CoV-2 IgM: NEGATIVE

## 2020-05-24 DIAGNOSIS — M4155 Other secondary scoliosis, thoracolumbar region: Secondary | ICD-10-CM | POA: Diagnosis not present

## 2020-05-24 DIAGNOSIS — M5126 Other intervertebral disc displacement, lumbar region: Secondary | ICD-10-CM | POA: Diagnosis not present

## 2020-05-24 DIAGNOSIS — M542 Cervicalgia: Secondary | ICD-10-CM | POA: Diagnosis not present

## 2020-05-24 DIAGNOSIS — M5442 Lumbago with sciatica, left side: Secondary | ICD-10-CM | POA: Diagnosis not present

## 2020-05-28 ENCOUNTER — Ambulatory Visit: Payer: Medicare HMO | Attending: Internal Medicine

## 2020-05-28 DIAGNOSIS — Z23 Encounter for immunization: Secondary | ICD-10-CM

## 2020-05-28 NOTE — Progress Notes (Signed)
   Covid-19 Vaccination Clinic  Name:  BRACK SHADDOCK    MRN: [id number] DOB: 07-29-1951  05/28/2020  Mr. Deller was observed post Covid-19 immunization for 15 minutes without incident. He was provided with Vaccine Information Sheet and instruction to access the V-Safe system.   Mr. Shieh was instructed to call 911 with any severe reactions post vaccine: Marland Kitchen Difficulty breathing  . Swelling of face and throat  . A fast heartbeat  . A bad rash all over body  . Dizziness and weakness

## 2020-06-24 ENCOUNTER — Encounter: Payer: Self-pay | Admitting: Dermatology

## 2020-06-24 NOTE — Progress Notes (Signed)
I, Lavonna Monarch, MD, have reviewed all documentation for this visit. The documentation on 06/24/20 for the exam, diagnosis, procedures, and orders are all accurate and complete.

## 2020-06-27 DIAGNOSIS — R69 Illness, unspecified: Secondary | ICD-10-CM | POA: Diagnosis not present

## 2020-08-17 ENCOUNTER — Ambulatory Visit: Payer: Medicare HMO

## 2020-10-21 ENCOUNTER — Ambulatory Visit (INDEPENDENT_AMBULATORY_CARE_PROVIDER_SITE_OTHER): Payer: Medicare HMO | Admitting: Adult Health

## 2020-10-21 ENCOUNTER — Encounter: Payer: Self-pay | Admitting: Adult Health

## 2020-10-21 ENCOUNTER — Other Ambulatory Visit: Payer: Self-pay

## 2020-10-21 VITALS — BP 130/80 | HR 58 | Temp 98.7°F | Ht 71.5 in | Wt 175.0 lb

## 2020-10-21 DIAGNOSIS — L988 Other specified disorders of the skin and subcutaneous tissue: Secondary | ICD-10-CM | POA: Diagnosis not present

## 2020-10-21 NOTE — Progress Notes (Signed)
Subjective:    Patient ID: Kenneth York, male    DOB: 30-May-1951, 70 y.o.   MRN: [id number]  HPI 70 year old male who  has a past medical history of Allergy, Cancer ([place]), and Scoliosis of thoracic spine.  He presents to the office today for an acute issue of a "bump" on his left forehead.  First noticed about 6 weeks ago.  No pain, no itching.  Has not grown in size over the last 6 months   Review of Systems See HPI   Past Medical History:  Diagnosis Date  . Allergy   . Cancer ([place])    basal cell   . Scoliosis of thoracic spine     Social History   Socioeconomic History  . Marital status: Married    Spouse name: Not on file  . Number of children: Not on file  . Years of education: Not on file  . Highest education level: Not on file  Occupational History  . Not on file  Tobacco Use  . Smoking status: Never Smoker  . Smokeless tobacco: Never Used  Substance and Sexual Activity  . Alcohol use: Yes    Alcohol/week: 4.0 standard drinks    Types: 4 Cans of beer per week  . Drug use: No  . Sexual activity: Not on file  Other Topics Concern  . 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 MOHS SURGERY  2004    Family History  Problem Relation Age of Onset  . Ovarian cancer Mother   . Heart disease Father   . Prostate cancer Father   . Colon cancer Neg Hx   . Esophageal cancer Neg Hx   . Rectal cancer Neg Hx   . Stomach cancer Neg Hx     No Known Allergies  Current Outpatient Medications on File Prior to Visit  Medication Sig  Dispense Refill  . aspirin 81 MG tablet Take 81 mg by mouth daily.    Marland Kitchen zinc gluconate 50 MG tablet Take 50 mg by mouth daily.     No current facility-administered medications on file prior to visit.    BP 130/80   Pulse (!) 58   Temp 98.7 F (37.1 C) (Temporal)   Ht 5' 11.5" (1.816 m)   Wt 175 lb (79.4 kg)   SpO2 96%   BMI 24.07 kg/m       Objective:   Physical Exam Vitals and nursing note reviewed.  Constitutional:      Appearance: Normal appearance.  Skin:    General: Skin is warm and dry.     Comments: Area of concern seems to be pouching of skin above the facial vein in his forehead.  No cysts, lipoma, abscess, or other concerning mass noted  Neurological:     Mental Status: He is alert.        Assessment & Plan:  1. Forehead wrinkles -Advised patient that there is no concern for his symptoms.  If he  wanted Botox would likely help with this.  Dorothyann Peng, NP

## 2020-11-25 ENCOUNTER — Encounter (HOSPITAL_BASED_OUTPATIENT_CLINIC_OR_DEPARTMENT_OTHER): Payer: Self-pay

## 2020-11-25 ENCOUNTER — Emergency Department (HOSPITAL_BASED_OUTPATIENT_CLINIC_OR_DEPARTMENT_OTHER)
Admission: EM | Admit: 2020-11-25 | Discharge: 2020-11-25 | Disposition: A | Discharge status: Home / Self Care | Payer: Medicare HMO | Attending: Emergency Medicine | Admitting: Emergency Medicine

## 2020-11-25 ENCOUNTER — Other Ambulatory Visit: Payer: Self-pay

## 2020-11-25 DIAGNOSIS — Z7982 Long term (current) use of aspirin: Secondary | ICD-10-CM | POA: Insufficient documentation

## 2020-11-25 DIAGNOSIS — S30861A Insect bite (nonvenomous) of abdominal wall, initial encounter: Secondary | ICD-10-CM | POA: Insufficient documentation

## 2020-11-25 DIAGNOSIS — W57XXXA Bitten or stung by nonvenomous insect and other nonvenomous arthropods, initial encounter: Secondary | ICD-10-CM | POA: Diagnosis not present

## 2020-11-25 DIAGNOSIS — Z85828 Personal history of other malignant neoplasm of skin: Secondary | ICD-10-CM | POA: Diagnosis not present

## 2020-11-25 MED ORDER — DOXYCYCLINE HYCLATE 100 MG PO CAPS: 100.0000 mg | ORAL_CAPSULE | Freq: Two times a day (BID) | ORAL | 0 refills | Status: AC

## 2020-11-25 NOTE — ED Provider Notes (Signed)
[facility] EMERGENCY DEPT Provider Note  CSN: [id number] Arrival date & time: 11/25/20 8416  Chief Complaint(s) Tick Removal  HPI Kenneth York is a 70 y.o. male here for tick bite on right flank. Noted today while showering. Didn't see it there yesterday. Tried to pull it out but head still in.   HPI  Past Medical History Past Medical History:  Diagnosis Date  . Allergy   . Cancer ([place])    basal cell   . Scoliosis of thoracic spine    Patient Active Problem List   Diagnosis Date Noted  . Hearing loss 05/17/2015  . Scoliosis of thoracic spine 12/20/2011  . Routine general medical examination at a health care facility 12/20/2011  . Nocturia 04/09/2011  . TYMPANIC MEMBRANE PERFORATION, RIGHT EAR 05/09/2010  . Allergic rhinitis 04/16/2008   Home Medication(s) Prior to Admission medications   Medication Sig Start Date End Date Taking? Authorizing Provider  aspirin 81 MG tablet Take 81 mg by mouth daily.    [provider]  zinc gluconate 50 MG tablet Take 50 mg by mouth daily.    [provider]                                                                                                                                    Past Surgical History Past Surgical History:  Procedure Laterality Date  . MOHS SURGERY  2004   Family History Family History  Problem Relation Age of Onset  . Ovarian cancer Mother   . Heart disease Father   . Prostate cancer Father   . Colon cancer Neg Hx   . Esophageal cancer Neg Hx   . Rectal cancer Neg Hx   . Stomach cancer Neg Hx     Social History Social History   Tobacco Use  . Smoking status: Never Smoker  . Smokeless tobacco: Never Used  Substance Use Topics  . Alcohol use: Yes    Alcohol/week: 4.0 standard drinks    Types: 4 Cans of beer per week  . Drug use: No   Allergies Patient has no known allergies.  Review of Systems Review of Systems All other systems are reviewed and are  negative for acute change except as noted in the HPI  Physical Exam Vital Signs  I have reviewed the triage vital signs BP (!) 146/94 (BP Location: Right Arm)   Pulse 69   Temp 97.6 F (36.4 C) (Oral)   Resp 18   Ht 5\' 11"  (1.803 m)   Wt 78.5 kg   SpO2 99%   BMI 24.13 kg/m   Physical Exam Vitals reviewed.  Constitutional:      General: He is not in acute distress.    Appearance: He is well-developed. He is not diaphoretic.  HENT:     Head: Normocephalic and atraumatic.     Jaw: No trismus.     Right Ear: External ear  normal.     Left Ear: External ear normal.     Nose: Nose normal.  Eyes:     General: No scleral icterus.    Conjunctiva/sclera: Conjunctivae normal.  Neck:     Trachea: Phonation normal.  Cardiovascular:     Rate and Rhythm: Normal rate and regular rhythm.  Pulmonary:     Effort: Pulmonary effort is normal. No respiratory distress.     Breath sounds: No stridor.  Abdominal:     General: There is no distension.  Musculoskeletal:        General: Normal range of motion.     Cervical back: Normal range of motion.  Skin:      Neurological:     Mental Status: He is alert and oriented to person, place, and time.  Psychiatric:        Behavior: Behavior normal.     ED Results and Treatments Labs (all labs ordered are listed, but only abnormal results are displayed) Labs Reviewed - No data to display                                                                                                                       EKG  EKG Interpretation  Date/Time:    Ventricular Rate:    PR Interval:    QRS Duration:   QT Interval:    QTC Calculation:   R Axis:     Text Interpretation:        Radiology No results found.  Pertinent labs & imaging results that were available during my care of the patient were reviewed by me and considered in my medical decision making (see chart for details).  Medications Ordered in ED Medications - No data to display                                                                                                                                   Procedures .Foreign Body Removal  Date/Time: 11/25/2020 6:40 AM Performed by: Fatima Blank, MD Authorized by: Fatima Blank, MD  Consent: Verbal consent obtained. Consent given by: patient Patient understanding: patient states understanding of the procedure being performed Body area: skin General location: trunk Location details: right flank  Sedation: Patient sedated: no  Complexity: simple 1 objects recovered. Objects recovered: tick head Post-procedure assessment: foreign body removed Patient tolerance: patient tolerated the procedure well with no immediate complications    (  including critical care time)  Medical Decision Making / ED Course I have reviewed the nursing notes for this encounter and the patient's prior records (if available in EHR or on provided paperwork).   Kenneth York was evaluated in Emergency Department on 11/25/2020 for the symptoms described in the history of present illness. He was evaluated in the context of the global COVID-19 pandemic, which necessitated consideration that the patient might be at risk for infection with the SARS-CoV-2 virus that causes COVID-19. Institutional protocols and algorithms that pertain to the evaluation of patients at risk for COVID-19 are in a state of rapid change based on information released by regulatory bodies including the CDC and federal and state organizations. These policies and algorithms were followed during the patient's care in the ED.  Tick head removed. Given Doxy ppx.      Final Clinical Impression(s) / ED Diagnoses Final diagnoses:  None    The patient appears reasonably screened and/or stabilized for discharge and I doubt any other medical condition or other [facility] requiring further screening, evaluation, or treatment in the ED at this time prior to  discharge. Safe for discharge with strict return precautions.  Disposition: Discharge  Condition: Good  I have discussed the results, Dx and Tx plan with the patient/family who expressed understanding and agree(s) with the plan. Discharge instructions discussed at length. The patient/family was given strict return precautions who verbalized understanding of the instructions. No further questions at time of discharge.    ED Discharge Orders         Ordered    doxycycline (VIBRAMYCIN) 100 MG capsule  2 times daily        11/25/20 2130          Follow Up: Dorothyann Peng, NP [address] [place] [address] [phone]  Call  as needed     This chart was dictated using voice recognition software.  Despite best efforts to proofread,  errors can occur which can change the documentation meaning.   Fatima Blank, MD 11/25/20 [phone]

## 2020-11-25 NOTE — ED Triage Notes (Signed)
Pt c/o tick embedded in his right side. Pt was taking a shower this morning and noticed the tick tried removing it himself.

## 2020-11-25 NOTE — ED Notes (Signed)
ED Provider at bedside. 

## 2020-12-12 ENCOUNTER — Ambulatory Visit: Payer: Medicare HMO | Admitting: Dermatology

## 2021-02-04 ENCOUNTER — Ambulatory Visit: Payer: Medicare HMO | Admitting: Podiatry

## 2021-02-04 ENCOUNTER — Other Ambulatory Visit: Payer: Self-pay

## 2021-02-04 DIAGNOSIS — L600 Ingrowing nail: Secondary | ICD-10-CM

## 2021-02-04 DIAGNOSIS — M79676 Pain in unspecified toe(s): Secondary | ICD-10-CM

## 2021-02-04 NOTE — Progress Notes (Signed)
  Subjective:  Patient ID: Kenneth York, male    DOB: 1951/03/31,  MRN: [id number]  Chief Complaint  Patient presents with   Nail Problem    possible toe fungus     70 y.o. male presents with the above complaint. History confirmed with patient. States he noticed a little red and pain around the nails  Objective:  Physical Exam: warm, good capillary refill, no trophic changes or ulcerative lesions, normal DP and PT pulses, and normal sensory exam.  Painful ingrowing nail at  medial border of the left, right, hallux; without warmth, erythema or drainage  Assessment:   1. Ingrown nail   2. Pain around toenail    Plan:  Patient was evaluated and treated and all questions answered.  Ingrown Nail, bilateral -Palliative debridement of ingrowing nails in slant-back fashion to patient relief. Dressed with povidone and band-aid. -No evidence of nail fungus  Return if symptoms worsen or fail to improve.

## 2021-03-06 ENCOUNTER — Other Ambulatory Visit: Payer: Self-pay

## 2021-03-07 ENCOUNTER — Encounter: Payer: Self-pay | Admitting: Adult Health

## 2021-03-07 ENCOUNTER — Ambulatory Visit (INDEPENDENT_AMBULATORY_CARE_PROVIDER_SITE_OTHER): Payer: Medicare HMO | Admitting: Adult Health

## 2021-03-07 VITALS — BP 118/98 | HR 67 | Temp 98.5°F | Ht 71.0 in | Wt 170.0 lb

## 2021-03-07 DIAGNOSIS — L0291 Cutaneous abscess, unspecified: Secondary | ICD-10-CM

## 2021-03-07 MED ORDER — DOXYCYCLINE HYCLATE 100 MG PO CAPS: 100.0000 mg | ORAL_CAPSULE | Freq: Two times a day (BID) | ORAL | 0 refills | Status: DC

## 2021-03-07 NOTE — Progress Notes (Signed)
Subjective:    Patient ID: Kenneth York, male    DOB: [birth year], 70 y.o.   MRN: [id number]  HPI 70 year old male who  has a past medical history of Allergy, Cancer ([place]), and Scoliosis of thoracic spine.  He presents to the office today for an acute issue.  Reports about a week ago he noticed a "knot or lump" on the left side of his anus.  Does not feel as though this knot or lump has gotten any larger over the last week.  Denies itching, burning, pain, drainage, or blood in stool.  Does have some mild discomfort.  Has not been using anything over-the-counter.   Review of Systems See HPI   Past Medical History:  Diagnosis Date   Allergy    Cancer ([place])    basal cell    Scoliosis of thoracic spine     Social History   Socioeconomic History   Marital status: Married    Spouse name: Not on file   Number of children: Not on file   Years of education: Not on file   Highest education level: Not on file  Occupational History   Not on file  Tobacco Use   Smoking status: Never   Smokeless tobacco: Never  Substance and Sexual Activity   Alcohol use: Yes    Alcohol/week: 4.0 standard drinks    Types: 4 Cans of beer per week   Drug use: No   Sexual activity: Not on file  Other Topics Concern   Not on file  Social History Narrative   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    Married    No children    Social Determinants of Radio broadcast assistant Strain: Not on Art therapist Insecurity: Not on file  Transportation Needs: Not on file  Physical Activity: Not on file  Stress: Not on file  Social Connections: Not on file  Intimate Partner Violence: Not on file    Past Surgical History:  Procedure Laterality Date   MOHS SURGERY  2004    Family History  Problem Relation Age of Onset   Ovarian cancer Mother    Heart disease Father    Prostate cancer Father    Colon cancer Neg Hx    Esophageal cancer Neg Hx    Rectal  cancer Neg Hx    Stomach cancer Neg Hx     No Known Allergies  Current Outpatient Medications on File Prior to Visit  Medication Sig Dispense Refill   Ascorbic Acid (VITAMIN C) 100 MG tablet Take 100 mg by mouth daily.     cetirizine (ZYRTEC) 10 MG tablet      aspirin 81 MG tablet Take 81 mg by mouth daily.     No current facility-administered medications on file prior to visit.    BP (!) 118/98   Pulse 67   Temp 98.5 F (36.9 C) (Oral)   Ht '5\' 11"'$  (1.803 m)   Wt 170 lb (77.1 kg)   SpO2 93%   BMI 23.71 kg/m       Objective:   Physical Exam Vitals and nursing note reviewed.  Constitutional:      Appearance: Normal appearance.  Genitourinary:      Comments: Noted swelling with what appear as a non fluctuant abscess. No redness at this time. No drainage.   Neurological:     Mental Status: He is alert.  Assessment & Plan:   1. Abscess -We will treat for her abscess.  Prescribe doxycycline twice daily x10 days.  Also advised warm Epson salt baths nightly.  Follow-up if no improvement or resolution - doxycycline (VIBRAMYCIN) 100 MG capsule; Take 1 capsule (100 mg total) by mouth 2 (two) times daily.  Dispense: 20 capsule; Refill: 0  Dorothyann Peng, NP

## 2021-04-21 ENCOUNTER — Encounter: Payer: Self-pay | Admitting: Adult Health

## 2021-04-26 ENCOUNTER — Ambulatory Visit: Payer: Medicare HMO | Admitting: Dermatology

## 2021-05-03 ENCOUNTER — Ambulatory Visit (INDEPENDENT_AMBULATORY_CARE_PROVIDER_SITE_OTHER): Payer: Medicare HMO | Admitting: Adult Health

## 2021-05-03 ENCOUNTER — Other Ambulatory Visit: Payer: Self-pay

## 2021-05-03 ENCOUNTER — Encounter: Payer: Self-pay | Admitting: Adult Health

## 2021-05-03 VITALS — BP 128/88 | HR 77 | Temp 98.6°F | Wt 172.0 lb

## 2021-05-03 DIAGNOSIS — K6289 Other specified diseases of anus and rectum: Secondary | ICD-10-CM | POA: Diagnosis not present

## 2021-05-03 NOTE — Progress Notes (Signed)
Subjective:    Patient ID: Kenneth York, male    DOB: 1951/02/03, 70 y.o.   MRN: [id number]  HPI 70 year old male who  has a past medical history of Allergy, Cancer ([place]), and Scoliosis of thoracic spine.  He presents to the office today for follow up regarding abscess on the left side of his anus. He was last seen on 03/07/2021 after a week prior he noticed a "knot or lump" on the left side of his anus.  He denied itching, burning, pain, drainage, or blood in stool.  He was having some mild discomfort.  On exam he seemed to have a nonfluctuant abscess and was prescribed doxycycline and advised Epson salt baths.  He finished his antibiotic and has continue with Epson salt baths but this lump has not resolved or gotten any better.  Currently having some mild discomfort when sitting.  Continues to deny pain elsewhere, drainage, redness, or warmth     Review of Systems See HPI   Past Medical History:  Diagnosis Date   Allergy    Cancer (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    Married    No children    Social Determinants of Radio broadcast assistant Strain: Not on Art therapist Insecurity: Not on file  Transportation Needs: Not on file  Physical Activity: Not on file  Stress: Not on file  Social Connections: Not on file  Intimate Partner  Violence: Not on file    Past Surgical History:  Procedure Laterality Date   MOHS SURGERY  2004    Family History  Problem Relation Age of Onset   Ovarian cancer Mother    Heart disease Father    Prostate cancer Father    Colon cancer Neg Hx    Esophageal cancer Neg Hx    Rectal cancer Neg Hx    Stomach cancer Neg Hx     No Known Allergies  Current Outpatient Medications on File Prior to Visit  Medication Sig Dispense Refill   Ascorbic Acid (VITAMIN C) 100 MG tablet Take 100 mg by mouth daily.     cetirizine (ZYRTEC) 10 MG tablet      doxycycline (VIBRAMYCIN) 100 MG capsule Take 1 capsule (100 mg total) by mouth 2 (two) times daily. 20 capsule 0   No current facility-administered medications on file prior to visit.    BP 128/88 (BP Location: Left Arm, Patient Position: Sitting, Cuff Size: Normal)   Pulse 77   Temp 98.6 F (37 C) (Oral)   Wt 172 lb (78 kg)   SpO2 96%   BMI 23.99 kg/m  Objective:   Physical Exam Vitals and nursing note reviewed.  Constitutional:      Appearance: Normal appearance.  Genitourinary:      Comments: Non fluctuant, hard cyst like structure Musculoskeletal:        General: Normal range of motion.  Skin:    General: Skin is warm and dry.     Capillary Refill: Capillary refill takes less than 2 seconds.  Neurological:     General: No focal deficit present.     Mental Status: He is alert and oriented to person, place, and time.  Psychiatric:        Mood and Affect: Mood normal.        Behavior: Behavior normal.        Thought Content: Thought content normal.        Judgment: Judgment normal.      Assessment & Plan:  1. Rectal cyst - No signs of active infection  - He would like to talk to general surgery about having this removed.  - Ambulatory referral to [place], NP

## 2021-05-04 ENCOUNTER — Telehealth: Payer: Self-pay | Admitting: Adult Health

## 2021-05-04 DIAGNOSIS — G8929 Other chronic pain: Secondary | ICD-10-CM

## 2021-05-04 DIAGNOSIS — M542 Cervicalgia: Secondary | ICD-10-CM | POA: Diagnosis not present

## 2021-05-04 DIAGNOSIS — M549 Dorsalgia, unspecified: Secondary | ICD-10-CM

## 2021-05-04 DIAGNOSIS — M25512 Pain in left shoulder: Secondary | ICD-10-CM | POA: Diagnosis not present

## 2021-05-04 NOTE — Telephone Encounter (Signed)
Spoke to pt and he stated that the referral he needs is for PT for neck and back. Pt needs a new referral. Pt stated that Dr.Ramos does his injections for back and neck issues. I advised pt that more than likely he will need to reach out to Dr. Nelva Bush for this but I will send to [facility] for advise.

## 2021-05-04 NOTE — Telephone Encounter (Signed)
PT called to see if Kenneth York if do the referral for him to go to [facility] again or does it need to be Dr.Ramos. If Kenneth York can then the fax # is [phone]. He stated that a few years back Dr.Todd had done the referral for him. Please advise.

## 2021-05-05 NOTE — Telephone Encounter (Signed)
Patient notified of update  and verbalized understanding. 

## 2021-05-05 NOTE — Addendum Note (Signed)
Addended by: Gwenyth Ober R on: 05/05/2021 11:29 AM   Modules accepted: Orders

## 2021-05-19 ENCOUNTER — Telehealth: Payer: Self-pay

## 2021-05-19 NOTE — Telephone Encounter (Signed)
Patient called stating that he would like a call back to get information on referral information for  Colonoscopy

## 2021-05-22 ENCOUNTER — Ambulatory Visit: Payer: Medicare HMO | Admitting: Dermatology

## 2021-05-22 ENCOUNTER — Encounter: Payer: Self-pay | Admitting: Dermatology

## 2021-05-22 ENCOUNTER — Other Ambulatory Visit: Payer: Self-pay

## 2021-05-22 DIAGNOSIS — L720 Epidermal cyst: Secondary | ICD-10-CM

## 2021-05-22 DIAGNOSIS — D3610 Benign neoplasm of peripheral nerves and autonomic nervous system, unspecified: Secondary | ICD-10-CM | POA: Diagnosis not present

## 2021-05-22 DIAGNOSIS — Z1283 Encounter for screening for malignant neoplasm of skin: Secondary | ICD-10-CM | POA: Diagnosis not present

## 2021-05-22 DIAGNOSIS — L821 Other seborrheic keratosis: Secondary | ICD-10-CM | POA: Diagnosis not present

## 2021-05-22 DIAGNOSIS — L814 Other melanin hyperpigmentation: Secondary | ICD-10-CM | POA: Diagnosis not present

## 2021-05-23 ENCOUNTER — Encounter: Payer: Self-pay | Admitting: Adult Health

## 2021-05-23 ENCOUNTER — Encounter: Payer: Medicare HMO | Admitting: Adult Health

## 2021-05-23 ENCOUNTER — Ambulatory Visit (INDEPENDENT_AMBULATORY_CARE_PROVIDER_SITE_OTHER): Payer: Medicare HMO | Admitting: Adult Health

## 2021-05-23 VITALS — BP 118/70 | Temp 98.9°F | Ht 70.75 in | Wt 173.0 lb

## 2021-05-23 DIAGNOSIS — J302 Other seasonal allergic rhinitis: Secondary | ICD-10-CM

## 2021-05-23 DIAGNOSIS — Z0000 Encounter for general adult medical examination without abnormal findings: Secondary | ICD-10-CM | POA: Diagnosis not present

## 2021-05-23 DIAGNOSIS — Z125 Encounter for screening for malignant neoplasm of prostate: Secondary | ICD-10-CM

## 2021-05-23 DIAGNOSIS — Z23 Encounter for immunization: Secondary | ICD-10-CM | POA: Diagnosis not present

## 2021-05-23 LAB — COMPREHENSIVE METABOLIC PANEL
ALT: 14 U/L (ref 0–53)
AST: 24 U/L (ref 0–37)
Albumin: 4.2 g/dL (ref 3.5–5.2)
Alkaline Phosphatase: 42 U/L (ref 39–117)
BUN: 13 mg/dL (ref 6–23)
CO2: 28 mEq/L (ref 19–32)
Calcium: 9.2 mg/dL (ref 8.4–10.5)
Chloride: 102 mEq/L (ref 96–112)
Creatinine, Ser: 0.97 mg/dL (ref 0.40–1.50)
GFR: 79.34 mL/min (ref >60.00)
Glucose, Bld: 87 mg/dL (ref 70–99)
Potassium: 3.8 mEq/L (ref 3.5–5.1)
Sodium: 138 mEq/L (ref 135–145)
Total Bilirubin: 0.8 mg/dL (ref 0.2–1.2)
Total Protein: 6.6 g/dL (ref 6.0–8.3)

## 2021-05-23 LAB — CBC WITH DIFFERENTIAL/PLATELET
Basophils Absolute: 0 10*3/uL (ref 0.0–0.1)
Basophils Relative: 0.5 % (ref 0.0–3.0)
Eosinophils Absolute: 0 10*3/uL (ref 0.0–0.7)
Eosinophils Relative: 0.5 % (ref 0.0–5.0)
HCT: 41.2 % (ref 39.0–52.0)
Hemoglobin: 13.7 g/dL (ref 13.0–17.0)
Lymphocytes Relative: 21.7 % (ref 12.0–46.0)
Lymphs Abs: 1.4 10*3/uL (ref 0.7–4.0)
MCHC: 33.3 g/dL (ref 30.0–36.0)
MCV: 92.7 fl (ref 78.0–100.0)
Monocytes Absolute: 0.8 10*3/uL (ref 0.1–1.0)
Monocytes Relative: 11.4 % (ref 3.0–12.0)
Neutro Abs: 4.4 10*3/uL (ref 1.4–7.7)
Neutrophils Relative %: 65.9 % (ref 43.0–77.0)
Platelets: 196 10*3/uL (ref 150.0–400.0)
RBC: 4.44 Mil/uL (ref 4.22–5.81)
RDW: 13 % (ref 11.5–15.5)
WBC: 6.6 10*3/uL (ref 4.0–10.5)

## 2021-05-23 LAB — LIPID PANEL
Cholesterol: 180 mg/dL (ref 0–200)
HDL: 68.4 mg/dL (ref >39.00)
LDL Cholesterol: 100 mg/dL — ABNORMAL HIGH (ref 0–99)
NonHDL: 111.8
Total CHOL/HDL Ratio: 3
Triglycerides: 58 mg/dL (ref 0.0–149.0)
VLDL: 11.6 mg/dL (ref 0.0–40.0)

## 2021-05-23 LAB — PSA: PSA: 1.53 ng/mL (ref 0.10–4.00)

## 2021-05-23 LAB — TSH: TSH: 1.24 u[IU]/mL (ref 0.35–5.50)

## 2021-05-23 NOTE — Patient Instructions (Addendum)
[facility]  Address: [address], [place], [place] [address] Phone: [phone]  We will follow up with you regarding your blood work   I will see you back in one year or sooner if needed

## 2021-05-23 NOTE — Progress Notes (Addendum)
Subjective:    Patient ID: Kenneth York, male    DOB: 03-Mar-1951, 70 y.o.   MRN: [id number]  HPI  Patient presents for yearly preventative medicine examination. He is a pleasant 70 year old male who  has a past medical history of Allergy, Cancer ([place]), and Scoliosis of thoracic spine.  Seasonal Allergies - Takes OTC medication PRN   H/o BCC - followed by Dermatology on a routine basis   All immunizations and health maintenance protocols were reviewed with the patient and needed orders were placed.  Appropriate screening laboratory values were ordered for the patient including screening of hyperlipidemia, renal function and hepatic function. If indicated by BPH, a PSA was ordered.  Medication reconciliation,  past medical history, social history, problem list and allergies were reviewed in detail with the patient  Goals were established with regard to weight loss, exercise, and  diet in compliance with medications. He stays very active and enjoys exercising. He is eating healthy.   He is up to date on routine colon cancer screening   Has an appointment with General Surgery on 06/05/2021 about a cyst on his perineal area.    Review of Systems  Constitutional: Negative.   HENT: Negative.    Eyes: Negative.   Respiratory: Negative.    Cardiovascular: Negative.   Gastrointestinal: Negative.   Endocrine: Negative.   Genitourinary: Negative.   Musculoskeletal: Negative.   Skin: Negative.   Allergic/Immunologic: Negative.   Neurological: Negative.   Hematological: Negative.   Psychiatric/Behavioral: Negative.    All other systems reviewed and are negative.   Past Medical History:  Diagnosis Date   Allergy    Cancer ([place])    basal cell    Scoliosis of thoracic spine     Social History   Socioeconomic History   Marital status: Married    Spouse name: Not on file   Number of children: Not on file   Years of education: Not on file   Highest education level: Not  on file  Occupational History   Not on file  Tobacco Use   Smoking status: Never   Smokeless tobacco: Never  Substance and Sexual Activity   Alcohol use: Yes    Alcohol/week: 4.0 standard drinks    Types: 4 Cans of beer per week   Drug use: No   Sexual activity: Not on file  Other Topics Concern   Not on file  Social History Narrative   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    Married    No children    Social Determinants of Radio broadcast assistant Strain: Not on Art therapist Insecurity: Not on file  Transportation Needs: Not on file  Physical Activity: Not on file  Stress: Not on file  Social Connections: Not on file  Intimate Partner Violence: Not on file    Past Surgical History:  Procedure Laterality Date   MOHS SURGERY  2004    Family History  Problem Relation Age of Onset   Ovarian cancer Mother    Heart disease Father    Prostate cancer Father    Colon cancer Neg Hx    Esophageal cancer Neg Hx    Rectal cancer Neg Hx    Stomach cancer Neg Hx     No Known Allergies  Current Outpatient Medications on File Prior to Visit  Medication Sig Dispense Refill   Ascorbic Acid (VITAMIN C) 100 MG tablet Take  100 mg by mouth daily.     cetirizine (ZYRTEC) 10 MG tablet      No current facility-administered medications on file prior to visit.    There were no vitals taken for this visit.      Objective:   Physical Exam Vitals and nursing note reviewed.  Constitutional:      General: He is not in acute distress.    Appearance: Normal appearance. He is well-developed and normal weight.  HENT:     Head: Normocephalic and atraumatic.     Right Ear: Tympanic membrane, ear canal and external ear normal. There is no impacted cerumen.     Left Ear: Tympanic membrane, ear canal and external ear normal. There is no impacted cerumen.     Nose: Nose normal. No congestion or rhinorrhea.     Mouth/Throat:     Mouth: Mucous  membranes are moist.     Pharynx: Oropharynx is clear. No oropharyngeal exudate or posterior oropharyngeal erythema.  Eyes:     General:        Right eye: No discharge.        Left eye: No discharge.     Extraocular Movements: Extraocular movements intact.     Conjunctiva/sclera: Conjunctivae normal.     Pupils: Pupils are equal, round, and reactive to light.  Neck:     Vascular: No carotid bruit.     Trachea: No tracheal deviation.  Cardiovascular:     Rate and Rhythm: Normal rate and regular rhythm.     Pulses: Normal pulses.     Heart sounds: Normal heart sounds. No murmur heard.   No friction rub. No gallop.  Pulmonary:     Effort: Pulmonary effort is normal. No respiratory distress.     Breath sounds: Normal breath sounds. No stridor. No wheezing, rhonchi or rales.  Chest:     Chest wall: No tenderness.  Abdominal:     General: Bowel sounds are normal. There is no distension.     Palpations: Abdomen is soft. There is no mass.     Tenderness: There is no abdominal tenderness. There is no right CVA tenderness, left CVA tenderness, guarding or rebound.     Hernia: No hernia is present.  Musculoskeletal:        General: No swelling, tenderness, deformity or signs of injury. Normal range of motion.     Right lower leg: No edema.     Left lower leg: No edema.  Lymphadenopathy:     Cervical: No cervical adenopathy.  Skin:    General: Skin is warm and dry.     Capillary Refill: Capillary refill takes less than 2 seconds.     Coloration: Skin is not jaundiced or pale.     Findings: No bruising, erythema, lesion or rash.  Neurological:     General: No focal deficit present.     Mental Status: He is alert and oriented to person, place, and time.     Cranial Nerves: No cranial nerve deficit.     Sensory: No sensory deficit.     Motor: No weakness.     Coordination: Coordination normal.     Gait: Gait normal.     Deep Tendon Reflexes: Reflexes normal.  Psychiatric:        Mood  and Affect: Mood normal.        Behavior: Behavior normal.        Thought Content: Thought content normal.        Judgment: Judgment  normal.      Assessment & Plan:  1. Routine general medical examination at a health care facility - Follow up in one year or sooner if needed - Benign exam.  - TSH; Future - Lipid panel; Future - CBC with Differential/Platelet; Future - Comprehensive metabolic panel; Future  2. Prostate cancer screening  - PSA; Future  3. Seasonal allergies - Continue OTC medications   4. Need for immunization against influenza  - Flu Vaccine QUAD 71mo+IM (Fluarix, Fluzone & Alfiuria Quad PF)   Dorothyann Peng, NP

## 2021-05-25 NOTE — Telephone Encounter (Signed)
Pt stated he is not sure the matter of the call. Pt stated he had no concerns or issues with colonoscopy. Note will be closed.

## 2021-06-04 ENCOUNTER — Encounter: Payer: Self-pay | Admitting: Dermatology

## 2021-06-04 NOTE — Progress Notes (Signed)
   Follow-Up Visit   Subjective  Kenneth York is a 70 y.o. male who presents for the following: Annual Exam (No new concerns).  General skin examination, several spots to check Location:  Duration:  Quality:  Associated Signs/Symptoms: Modifying Factors:  Severity:  Timing: Context:   Objective  Well appearing patient in no apparent distress; mood and affect are within normal limits. Mid Tip of Nose 3 mm tan monochrome symmetric macule; no dermoscopic atypia  Left Anterior Neck 6 mm white noninflamed dermal papule  Torso - Posterior (Back) Full body skin examination: No atypical pigmented lesions or nonmelanoma skin cancer.  Left Upper Back 5 mm brown flattopped textured papule  Right Breast Pink soft partially compressible 6 mm papule    A full examination was performed including scalp, head, eyes, ears, nose, lips, neck, chest, axillae, abdomen, back, buttocks, bilateral upper extremities, bilateral lower extremities, hands, feet, fingers, toes, fingernails, and toenails. All findings within normal limits unless otherwise noted below.   Assessment & Plan    Lentigo Mid Tip of Nose  Recheck as needed change  Epidermal cyst Left Anterior Neck  Patient will decide if he wants to schedule surgical removal in the future.  Encounter for screening for malignant neoplasm of skin Torso - Posterior (Back)  Annual skin examination  Seborrheic keratosis Left Upper Back  Leave if stable  Neurofibroma Right Breast  Historically stable, no intervention necessary      I, Lavonna Monarch, MD, have reviewed all documentation for this visit.  The documentation on 06/04/21 for the exam, diagnosis, procedures, and orders are all accurate and complete.

## 2021-06-05 DIAGNOSIS — N5089 Other specified disorders of the male genital organs: Secondary | ICD-10-CM | POA: Diagnosis not present

## 2021-06-12 ENCOUNTER — Encounter: Payer: Self-pay | Admitting: Adult Health

## 2021-06-13 NOTE — Telephone Encounter (Signed)
FYI

## 2021-06-27 ENCOUNTER — Encounter: Payer: Self-pay | Admitting: Adult Health

## 2021-06-27 NOTE — Telephone Encounter (Signed)
Please advise 

## 2021-06-28 ENCOUNTER — Other Ambulatory Visit: Payer: Self-pay | Admitting: General Surgery

## 2021-06-28 DIAGNOSIS — N5089 Other specified disorders of the male genital organs: Secondary | ICD-10-CM

## 2021-07-12 DIAGNOSIS — H11152 Pinguecula, left eye: Secondary | ICD-10-CM | POA: Diagnosis not present

## 2021-07-12 DIAGNOSIS — H40013 Open angle with borderline findings, low risk, bilateral: Secondary | ICD-10-CM | POA: Diagnosis not present

## 2021-07-12 DIAGNOSIS — D492 Neoplasm of unspecified behavior of bone, soft tissue, and skin: Secondary | ICD-10-CM | POA: Diagnosis not present

## 2021-07-12 DIAGNOSIS — H2513 Age-related nuclear cataract, bilateral: Secondary | ICD-10-CM | POA: Diagnosis not present

## 2021-07-18 ENCOUNTER — Ambulatory Visit
Admission: RE | Admit: 2021-07-18 | Discharge: 2021-07-18 | Disposition: A | Discharge status: Home / Self Care | Payer: Medicare HMO | Source: Ambulatory Visit | Attending: General Surgery | Admitting: General Surgery

## 2021-07-18 DIAGNOSIS — N5089 Other specified disorders of the male genital organs: Secondary | ICD-10-CM

## 2021-07-18 DIAGNOSIS — K610 Anal abscess: Secondary | ICD-10-CM | POA: Diagnosis not present

## 2021-07-18 MED ORDER — GADOBENATE DIMEGLUMINE 529 MG/ML IV SOLN
17.0000 mL | Freq: Once | INTRAVENOUS | Status: AC | PRN
  Administered 2021-07-18: 17 mL via INTRAVENOUS

## 2021-07-21 DIAGNOSIS — N5089 Other specified disorders of the male genital organs: Secondary | ICD-10-CM | POA: Diagnosis not present

## 2021-07-31 ENCOUNTER — Telehealth: Payer: Self-pay | Admitting: Adult Health

## 2021-07-31 NOTE — Telephone Encounter (Signed)
Left message for patient to call back and schedule Medicare Annual Wellness Visit (AWV) either virtually or in office. Left  my Herbie Drape number [phone]   awvi 04/13/17 per [facility]  please schedule at anytime with [facility] Nurse Health Advisor 1 or 2   This should be a 45 minute visit.

## 2021-08-04 ENCOUNTER — Telehealth (INDEPENDENT_AMBULATORY_CARE_PROVIDER_SITE_OTHER): Payer: Medicare HMO | Admitting: Adult Health

## 2021-08-04 DIAGNOSIS — U071 COVID-19: Secondary | ICD-10-CM | POA: Diagnosis not present

## 2021-08-04 MED ORDER — NIRMATRELVIR/RITONAVIR (PAXLOVID)TABLET
3.0000 | ORAL_TABLET | Freq: Two times a day (BID) | ORAL | 0 refills | Status: AC
Start: 2021-08-04 — End: 2021-08-09

## 2021-08-04 NOTE — Progress Notes (Signed)
Virtual Visit via Video Note  I connected with Kenneth York on 08/04/21 at 11:30 AM EST by a video enabled telemedicine application and verified that I am speaking with the correct person using two identifiers.  Location patient: home Location provider:work or home office Persons participating in the virtual visit: patient, provider  I discussed the limitations of evaluation and management by telemedicine and the availability of in person appointments. The patient expressed understanding and agreed to proceed.   HPI: 70 year old male who  has a past medical history of Allergy, Cancer ([place]), and Scoliosis of thoracic spine.  He tested positive for Covid 19 today. His symptoms started two days ago. His symptoms include that off body aches, fatigue, headaches, sinus pressure, and cough. Denies fevers, chills, shortness of breath or wheezing. His wife also tested positive.   He is vaccinated against Covid 19   ROS: See pertinent positives and negatives per HPI.  Past Medical History:  Diagnosis Date   Allergy    Cancer ([place])    basal cell    Scoliosis of thoracic spine     Past Surgical History:  Procedure Laterality Date   MOHS SURGERY  2004    Family History  Problem Relation Age of Onset   Ovarian cancer Mother    Heart disease Father    Prostate cancer Father    Colon cancer Neg Hx    Esophageal cancer Neg Hx    Rectal cancer Neg Hx    Stomach cancer Neg Hx        Current Outpatient Medications:    Ascorbic Acid (VITAMIN C) 100 MG tablet, Take 100 mg by mouth daily., Disp: , Rfl:    cetirizine (ZYRTEC) 10 MG tablet, , Disp: , Rfl:    Zinc 100 MG TABS, Take by mouth., Disp: , Rfl:   EXAM:  VITALS per patient if applicable:  GENERAL: alert, oriented, appears well and in no acute distress  HEENT: atraumatic, conjunttiva clear, no obvious abnormalities on inspection of external nose and ears  NECK: normal movements of the head and neck  LUNGS: on  inspection no signs of respiratory distress, breathing rate appears normal, no obvious gross SOB, gasping or wheezing  CV: no obvious cyanosis  MS: moves all visible extremities without noticeable abnormality  PSYCH/NEURO: pleasant and cooperative, no obvious depression or anxiety, speech and thought processing grossly intact  ASSESSMENT AND PLAN:  Discussed the following assessment and plan:  1. COVID-19 - Will send in Paxlovid. He is unsure if he will take it or not.  - nirmatrelvir/ritonavir EUA (PAXLOVID) 20 x 150 MG & 10 x 100MG  TABS; Take 3 tablets by mouth 2 (two) times daily for 5 days. (Take nirmatrelvir 150 mg two tablets twice daily for 5 days and ritonavir 100 mg one tablet twice daily for 5 days) Patient GFR is 79  Dispense: 30 tablet; Refill: 0       I discussed the assessment and treatment plan with the patient. The patient was provided an opportunity to ask questions and all were answered. The patient agreed with the plan and demonstrated an understanding of the instructions.   The patient was advised to call back or seek an in-person evaluation if the symptoms worsen or if the condition fails to improve as anticipated.   Dorothyann Peng, NP

## 2021-08-15 DIAGNOSIS — L732 Hidradenitis suppurativa: Secondary | ICD-10-CM | POA: Diagnosis not present

## 2021-09-05 ENCOUNTER — Telehealth: Payer: Self-pay | Admitting: Adult Health

## 2021-09-05 NOTE — Telephone Encounter (Signed)
Left message for patient to call back and schedule Medicare Annual Wellness Visit (AWV) either virtually or in office. Left  my Kenneth York number [phone]    awvi 04/13/17 per [facility]  please schedule at anytime with [facility] Nurse Health Advisor 1 or 2   This should be a 45 minute visit.

## 2021-09-08 ENCOUNTER — Ambulatory Visit (INDEPENDENT_AMBULATORY_CARE_PROVIDER_SITE_OTHER): Payer: Medicare HMO

## 2021-09-08 VITALS — Ht 71.0 in | Wt 173.0 lb

## 2021-09-08 DIAGNOSIS — Z0000 Encounter for general adult medical examination without abnormal findings: Secondary | ICD-10-CM

## 2021-09-08 NOTE — Progress Notes (Signed)
Subjective:   Kenneth York is a 71 y.o. male who presents for Medicare Annual/Subsequent preventive examination.  Review of Systems    No Ros Cardiac Risk Factors include: advanced age (>11men, >21 women);male gender    Objective:    Today's Vitals   09/08/21 1111  Weight: 173 lb (78.5 kg)  Height: 5\' 11"  (1.803 m)   Body mass index is 24.13 kg/m.  Advanced Directives 09/08/2021 11/25/2020 02/18/2015 02/03/2015  Does Patient Have a Medical Advance Directive? Yes No Yes Yes  Type of Paramedic of [place];Living will - Healthcare Power of [place];Living will  Does patient want to make changes to medical advance directive? No - Patient declined - - No - Patient declined  Copy of [place] in Chart? No - copy requested - - No - copy requested    Current Medications (verified) Outpatient Encounter Medications as of 09/08/2021  Medication Sig   Ascorbic Acid (VITAMIN C) 100 MG tablet Take 100 mg by mouth daily.   cetirizine (ZYRTEC) 10 MG tablet    Zinc 100 MG TABS Take by mouth.   No facility-administered encounter medications on file as of 09/08/2021.    Allergies (verified) Patient has no known allergies.   History: Past Medical History:  Diagnosis Date   Allergy    Cancer ([place])    basal cell    Scoliosis of thoracic spine    Past Surgical History:  Procedure Laterality Date   MOHS SURGERY  2004   Family History  Problem Relation Age of Onset   Ovarian cancer Mother    Heart disease Father    Prostate cancer Father    Colon cancer Neg Hx    Esophageal cancer Neg Hx    Rectal cancer Neg Hx    Stomach cancer Neg Hx    Social History   Socioeconomic History   Marital status: Married    Spouse name: Not on file   Number of children: Not on file   Years of education: Not on file   Highest education level: Not on file  Occupational History   Not on file  Tobacco Use   Smoking  status: Never   Smokeless tobacco: Never  Substance and Sexual Activity   Alcohol use: Yes    Alcohol/week: 4.0 standard drinks    Types: 4 Cans of beer per week   Drug use: No   Sexual activity: Not on file  Other Topics Concern   Not on file  Social History Narrative   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    Married    No children    Social Determinants of Radio broadcast assistant Strain: Low Risk    Difficulty of Paying Living Expenses: Not hard at all  Food Insecurity: No Food Insecurity   Worried About Charity fundraiser in the Last Year: Never true   Arboriculturist in the Last Year: Never true  Transportation Needs: No Transportation Needs   Lack of Transportation (Medical): No   Lack of Transportation (Non-Medical): No  Physical Activity: Sufficiently Active   Days of Exercise per Week: 3 days   Minutes of Exercise per Session: 60 min  Stress: No Stress Concern Present   Feeling of Stress : Not at all  Social Connections: Socially Integrated   Frequency of Communication with Friends and Family: More than three times a week  Frequency of Social Gatherings with Friends and Family: More than three times a week   Attends Religious Services: More than 4 times per year   Active Member of Clubs or Organizations: Yes   Attends Music therapist: More than 4 times per year   Marital Status: Married   Clinical Intake:  Diabetic? No  Activities of Daily Living In your present state of health, do you have any difficulty performing the following activities: 09/08/2021 10/21/2020  Hearing? N N  Vision? N N  Difficulty concentrating or making decisions? N N  Walking or climbing stairs? N N  Dressing or bathing? N N  Doing errands, shopping? N N  Preparing Food and eating ? N -  Using the Toilet? N -  In the past six months, have you accidently leaked urine? N -  Do you have problems with loss of bowel control? N -   Managing your Medications? N -  Managing your Finances? N -  Housekeeping or managing your Housekeeping? N -  Some recent data might be hidden    Patient Care Team: Dorothyann Peng, NP as PCP - General (Family Medicine) Lavonna Monarch, MD as Consulting Physician (Dermatology)  Indicate any recent Medical Services you may have received from other than [facility] providers in the past year (date may be approximate).     Assessment:   This is a routine wellness examination for Kenneth York.  Virtual Visit via Telephone Note  I connected with  Kenneth York on 09/08/21 at 11:15 AM EST by telephone and verified that I am speaking with the correct person using two identifiers.  Location: Patient: Home Provider: Office Persons participating in the virtual visit: patient/Nurse Health Advisor   I discussed the limitations, risks, security and privacy concerns of performing an evaluation and management service by telephone and the availability of in person appointments. The patient expressed understanding and agreed to proceed.  Interactive audio and video telecommunications were attempted between this nurse and patient, however failed, due to patient having technical difficulties OR patient did not have access to video capability.  We continued and completed visit with audio only.  Some vital signs may be absent or patient reported.   Criselda Peaches, LPN   Hearing/Vision screen Hearing Screening - Comments:: No difficulty hearing Vision Screening - Comments:: Wears reading glasses. Followed by Dr Schuyler Amor  Dietary issues and exercise activities discussed: Current Exercise Habits: Home exercise routine, Type of exercise: walking, Time (Minutes): 60, Frequency (Times/Week): 3, Weekly Exercise (Minutes/Week): 180   Goals Addressed             This Visit's Progress    Increase physical activity         Depression Screen PHQ 2/9 Scores 09/08/2021 10/21/2020  PHQ - 2 Score 0 0    Fall  Risk Fall Risk  09/08/2021  Falls in the past year? 0  Number falls in past yr: 0  Injury with Fall? 0    FALL RISK PREVENTION PERTAINING TO THE HOME:  Any stairs in or around the home? Yes  If so, are there any without handrails? No  Home free of loose throw rugs in walkways, pet beds, electrical cords, etc? Yes  Adequate lighting in your home to reduce risk of falls? Yes   ASSISTIVE DEVICES UTILIZED TO PREVENT FALLS:  Life alert? No  Use of a cane, walker or w/c? No  Grab bars in the bathroom? Yes Shower chair or bench in shower? No  Elevated  toilet seat or a handicapped toilet? No   TIMED UP AND GO:  Was the test performed? No . Audio Visit  Cognitive Function:   6CIT Screen 09/08/2021  What Year? 0 points  What month? 0 points  What time? 0 points  Count back from 20 0 points  Months in reverse 0 points  Repeat phrase 0 points  Total Score 0    Immunizations Immunization History  Administered Date(s) Administered   Influenza Split 06/16/2013   Influenza Whole 07/14/2009, 05/18/2010   Influenza, High Dose Seasonal PF 06/27/2017, 06/11/2019   Influenza,inj,Quad PF,6+ Mos 05/17/2015, 05/23/2021   Influenza-Unspecified 06/13/2020   PFIZER(Purple Top)SARS-COV-2 Vaccination 09/11/2019, 10/02/2019, 05/28/2020   Pneumococcal Conjugate-13 01/16/2017   Pneumococcal Polysaccharide-23 08/21/2018   Td 08/13/1998, 07/14/2009   Zoster Recombinat (Shingrix) 05/23/2020, 05/23/2021   Zoster, Live 07/26/2011   TDAP status: Up to date  Covid-19 vaccine status: Completed vaccines  Qualifies for Shingles Vaccine? Yes   Zostavax completed Yes   Shingrix Completed?: Yes  Screening Tests Health Maintenance  Topic Date Due   COVID-19 Vaccine (4 - Booster for Pfizer series) 09/24/2021 (Originally 07/23/2020)   TETANUS/TDAP  09/08/2022 (Originally 07/15/2019)   COLONOSCOPY (Pts 45-54yrs Insurance coverage will need to be confirmed)  02/17/2025   Pneumonia Vaccine 73+ Years old   Completed   INFLUENZA VACCINE  Completed   Hepatitis C Screening  Completed   Zoster Vaccines- Shingrix  Completed   HPV VACCINES  Aged Out    Health Maintenance  There are no preventive care reminders to display for this patient.    Additional Screening:   Vision Screening: Recommended annual ophthalmology exams for early detection of glaucoma and other disorders of the eye. Is the patient up to date with their annual eye exam?  Yes  Who is the provider or what is the name of the office in which the patient attends annual eye exams? Followed by Dr Schuyler Amor  Dental Screening: Recommended annual dental exams for proper oral hygiene  Community Resource Referral / Chronic Care Management:  CRR required this visit?  No   CCM required this visit?  No      Plan:     I have personally reviewed and noted the following in the patients chart:   Medical and social history Use of alcohol, tobacco or illicit drugs  Current medications and supplements including opioid prescriptions. Patient is not currently taking opioid prescriptions. Functional ability and status Nutritional status Physical activity Advanced directives List of other physicians Hospitalizations, surgeries, and ER visits in previous 12 months Vitals Screenings to include cognitive, depression, and falls Referrals and appointments  In addition, I have reviewed and discussed with patient certain preventive protocols, quality metrics, and best practice recommendations. A written personalized care plan for preventive services as well as general preventive health recommendations were provided to patient.     Criselda Peaches, LPN   3/38/2505

## 2021-09-08 NOTE — Patient Instructions (Signed)
[name] , Thank you for taking time to come for your Medicare Wellness Visit. I appreciate your ongoing commitment to your health goals. Please review the following plan we discussed and let me know if I can assist you in the future.   These are the goals we discussed:  Goals      Increase physical activity        This is a list of the screening recommended for you and due dates:  Health Maintenance  Topic Date Due   COVID-19 Vaccine (4 - Booster for Pfizer series) 09/24/2021*   Tetanus Vaccine  09/08/2022*   Colon Cancer Screening  02/17/2025   Pneumonia Vaccine  Completed   Flu Shot  Completed   Hepatitis C Screening: USPSTF Recommendation to screen - Ages 18-79 yo.  Completed   Zoster (Shingles) Vaccine  Completed   HPV Vaccine  Aged Out  *Topic was postponed. The date shown is not the original due date.   Advanced directives: Yes  Conditions/risks identified: None  Next appointment: Follow up in one year for your annual wellness visit.   Preventive Care 71 Years and Older, Male Preventive care refers to lifestyle choices and visits with your health care provider that can promote health and wellness. What does preventive care include? A yearly physical exam. This is also called an annual well check. Dental exams once or twice a year. Routine eye exams. Ask your health care provider how often you should have your eyes checked. Personal lifestyle choices, including: Daily care of your teeth and gums. Regular physical activity. Eating a healthy diet. Avoiding tobacco and drug use. Limiting alcohol use. Practicing safe sex. Taking low doses of aspirin every day. Taking vitamin and mineral supplements as recommended by your health care provider. What happens during an annual well check? The services and screenings done by your health care provider during your annual well check will depend on your age, overall health, lifestyle risk factors, and family history of  disease. Counseling  Your health care provider may ask you questions about your: Alcohol use. Tobacco use. Drug use. Emotional well-being. Home and relationship well-being. Sexual activity. Eating habits. History of falls. Memory and ability to understand (cognition). Work and work Statistician. Screening  You may have the following tests or measurements: Height, weight, and BMI. Blood pressure. Lipid and cholesterol levels. These may be checked every 5 years, or more frequently if you are over 71 years old. Skin check. Lung cancer screening. You may have this screening every year starting at age 71 if you have a 30-pack-year history of smoking and currently smoke or have quit within the past 15 years. Fecal occult blood test (FOBT) of the stool. You may have this test every year starting at age 71. Flexible sigmoidoscopy or colonoscopy. You may have a sigmoidoscopy every 5 years or a colonoscopy every 10 years starting at age 71. Prostate cancer screening. Recommendations will vary depending on your family history and other risks. Hepatitis C blood test. Hepatitis B blood test. Sexually transmitted disease (STD) testing. Diabetes screening. This is done by checking your blood sugar (glucose) after you have not eaten for a while (fasting). You may have this done every 1-3 years. Abdominal aortic aneurysm (AAA) screening. You may need this if you are a current or former smoker. Osteoporosis. You may be screened starting at age 71 if you are at high risk. Talk with your health care provider about your test results, treatment options, and if necessary, the need  for more tests. Vaccines  Your health care provider may recommend certain vaccines, such as: Influenza vaccine. This is recommended every year. Tetanus, diphtheria, and acellular pertussis (Tdap, Td) vaccine. You may need a Td booster every 10 years. Zoster vaccine. You may need this after age 5. Pneumococcal 13-valent  conjugate (PCV13) vaccine. One dose is recommended after age 71. Pneumococcal polysaccharide (PPSV23) vaccine. One dose is recommended after age 71. Talk to your health care provider about which screenings and vaccines you need and how often you need them. This information is not intended to replace advice given to you by your health care provider. Make sure you discuss any questions you have with your health care provider. Document Released: 08/26/2015 Document Revised: 04/18/2016 Document Reviewed: 05/31/2015 Elsevier Interactive Patient Education  2017 [place] Prevention in the Home Falls can cause injuries. They can happen to people of all ages. There are many things you can do to make your home safe and to help prevent falls. What can I do on the outside of my home? Regularly fix the edges of walkways and driveways and fix any cracks. Remove anything that might make you trip as you walk through a door, such as a raised step or threshold. Trim any bushes or trees on the path to your home. Use bright outdoor lighting. Clear any walking paths of anything that might make someone trip, such as rocks or tools. Regularly check to see if handrails are loose or broken. Make sure that both sides of any steps have handrails. Any raised decks and porches should have guardrails on the edges. Have any leaves, snow, or ice cleared regularly. Use sand or salt on walking paths during winter. Clean up any spills in your garage right away. This includes oil or grease spills. What can I do in the bathroom? Use night lights. Install grab bars by the toilet and in the tub and shower. Do not use towel bars as grab bars. Use non-skid mats or decals in the tub or shower. If you need to sit down in the shower, use a plastic, non-slip stool. Keep the floor dry. Clean up any water that spills on the floor as soon as it happens. Remove soap buildup in the tub or shower regularly. Attach bath mats  securely with double-sided non-slip rug tape. Do not have throw rugs and other things on the floor that can make you trip. What can I do in the bedroom? Use night lights. Make sure that you have a light by your bed that is easy to reach. Do not use any sheets or blankets that are too big for your bed. They should not hang down onto the floor. Have a firm chair that has side arms. You can use this for support while you get dressed. Do not have throw rugs and other things on the floor that can make you trip. What can I do in the kitchen? Clean up any spills right away. Avoid walking on wet floors. Keep items that you use a lot in easy-to-reach places. If you need to reach something above you, use a strong step stool that has a grab bar. Keep electrical cords out of the way. Do not use floor polish or wax that makes floors slippery. If you must use wax, use non-skid floor wax. Do not have throw rugs and other things on the floor that can make you trip. What can I do with my stairs? Do not leave any items on the stairs.  Make sure that there are handrails on both sides of the stairs and use them. Fix handrails that are broken or loose. Make sure that handrails are as long as the stairways. Check any carpeting to make sure that it is firmly attached to the stairs. Fix any carpet that is loose or worn. Avoid having throw rugs at the top or bottom of the stairs. If you do have throw rugs, attach them to the floor with carpet tape. Make sure that you have a light switch at the top of the stairs and the bottom of the stairs. If you do not have them, ask someone to add them for you. What else can I do to help prevent falls? Wear shoes that: Do not have high heels. Have rubber bottoms. Are comfortable and fit you well. Are closed at the toe. Do not wear sandals. If you use a stepladder: Make sure that it is fully opened. Do not climb a closed stepladder. Make sure that both sides of the stepladder  are locked into place. Ask someone to hold it for you, if possible. Clearly mark and make sure that you can see: Any grab bars or handrails. First and last steps. Where the edge of each step is. Use tools that help you move around (mobility aids) if they are needed. These include: Canes. Walkers. Scooters. Crutches. Turn on the lights when you go into a dark area. Replace any light bulbs as soon as they burn out. Set up your furniture so you have a clear path. Avoid moving your furniture around. If any of your floors are uneven, fix them. If there are any pets around you, be aware of where they are. Review your medicines with your doctor. Some medicines can make you feel dizzy. This can increase your chance of falling. Ask your doctor what other things that you can do to help prevent falls. This information is not intended to replace advice given to you by your health care provider. Make sure you discuss any questions you have with your health care provider. Document Released: 05/26/2009 Document Revised: 01/05/2016 Document Reviewed: 09/03/2014 Elsevier Interactive Patient Education  2017 [employer].

## 2022-01-11 ENCOUNTER — Ambulatory Visit (INDEPENDENT_AMBULATORY_CARE_PROVIDER_SITE_OTHER): Payer: Medicare HMO

## 2022-01-11 ENCOUNTER — Ambulatory Visit (INDEPENDENT_AMBULATORY_CARE_PROVIDER_SITE_OTHER): Payer: Medicare HMO | Admitting: Adult Health

## 2022-01-11 ENCOUNTER — Encounter: Payer: Self-pay | Admitting: Adult Health

## 2022-01-11 VITALS — BP 108/72 | HR 75 | Temp 98.6°F | Ht 71.0 in | Wt 170.0 lb

## 2022-01-11 DIAGNOSIS — M545 Low back pain, unspecified: Secondary | ICD-10-CM

## 2022-01-11 DIAGNOSIS — M4126 Other idiopathic scoliosis, lumbar region: Secondary | ICD-10-CM | POA: Diagnosis not present

## 2022-01-11 DIAGNOSIS — G8929 Other chronic pain: Secondary | ICD-10-CM

## 2022-01-11 DIAGNOSIS — I878 Other specified disorders of veins: Secondary | ICD-10-CM | POA: Diagnosis not present

## 2022-01-11 DIAGNOSIS — M542 Cervicalgia: Secondary | ICD-10-CM | POA: Diagnosis not present

## 2022-01-11 DIAGNOSIS — M25512 Pain in left shoulder: Secondary | ICD-10-CM | POA: Diagnosis not present

## 2022-01-11 NOTE — Progress Notes (Signed)
Subjective:    Patient ID: Kenneth York, male    DOB: 1951/01/03, 71 y.o.   MRN: [id number]  HPI 71 year old male who  has a past medical history of Allergy, Cancer ([place]), and Scoliosis of thoracic spine.  He presents to the office today for follow up regarding chronic left lower back pain.  This has been an ongoing issue for the patient.  Feels as though has gotten slightly worse over the last week or so.  Continues to work 2 to 3 days a week and is active at his job, he is also working in the yard and doing things around the house when he is at home.  Pain is located in the left lower back and can radiate into the buttocks.  No radiating pain down the leg.  He does not endorse hip pain or feeling of slipping when he walks.  At times he will feel as though his left leg will give out   Review of Systems See HPI   Past Medical History:  Diagnosis Date   Allergy    Cancer ([place])    basal cell    Scoliosis of thoracic spine     Social History   Socioeconomic History   Marital status: Married    Spouse name: Not on file   Number of children: Not on file   Years of education: Not on file   Highest education level: Not on file  Occupational History   Not on file  Tobacco Use   Smoking status: Never   Smokeless tobacco: Never  Substance and Sexual Activity   Alcohol use: Yes    Alcohol/week: 4.0 standard drinks    Types: 4 Cans of beer per week   Drug use: No   Sexual activity: Not on file  Other Topics Concern   Not on file  Social History Narrative   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    Married    No children    Social Determinants of Radio broadcast assistant Strain: Low Risk    Difficulty of Paying Living Expenses: Not hard at all  Food Insecurity: No Food Insecurity   Worried About Charity fundraiser in the Last Year: Never true   Arboriculturist in the Last Year: Never true  Transportation Needs: No  Transportation Needs   Lack of Transportation (Medical): No   Lack of Transportation (Non-Medical): No  Physical Activity: Sufficiently Active   Days of Exercise per Week: 3 days   Minutes of Exercise per Session: 60 min  Stress: No Stress Concern Present   Feeling of Stress : Not at all  Social Connections: Socially Integrated   Frequency of Communication with Friends and Family: More than three times a week   Frequency of Social Gatherings with Friends and Family: More than three times a week   Attends Religious Services: More than 4 times per year   Active Member of [employer] or Organizations: Yes   Attends Music therapist: More than 4 times per year   Marital Status: Married  Human resources officer Violence: Not At Risk   Fear of Current or Ex-Partner: No   Emotionally Abused: No   Physically Abused: No   Sexually Abused: No    Past Surgical History:  Procedure Laterality Date   MOHS SURGERY  2004    Family History  Problem Relation Age of Onset   Ovarian  cancer Mother    Heart disease Father    Prostate cancer Father    Colon cancer Neg Hx    Esophageal cancer Neg Hx    Rectal cancer Neg Hx    Stomach cancer Neg Hx     No Known Allergies  Current Outpatient Medications on File Prior to Visit  Medication Sig Dispense Refill   Ascorbic Acid (VITAMIN C) 100 MG tablet Take 100 mg by mouth daily.     cetirizine (ZYRTEC) 10 MG tablet      Zinc 100 MG TABS Take by mouth.     No current facility-administered medications on file prior to visit.    BP 108/72   Pulse 75   Temp 98.6 F (37 C) (Oral)   Ht '5\' 11"'$  (1.803 m)   Wt 170 lb (77.1 kg)   SpO2 98%   BMI 23.71 kg/m       Objective:   Physical Exam Vitals and nursing note reviewed.  Constitutional:      Appearance: Normal appearance.  Cardiovascular:     Rate and Rhythm: Regular rhythm.  Musculoskeletal:        General: Tenderness present.     Thoracic back: Scoliosis present.     Lumbar back:  Tenderness present. No bony tenderness. Normal range of motion.       Back:     Comments: He did have some discomfort in his lower lumbar paraspinal muscles with straight leg raise.  Knee-to-chest to reduce the discomfort.  No significant pain with internal or external hip rotation  Neurological:     General: No focal deficit present.     Mental Status: He is alert and oriented to person, place, and time.  Psychiatric:        Mood and Affect: Mood normal.        Behavior: Behavior normal.        Thought Content: Thought content normal.        Judgment: Judgment normal.       Assessment & Plan:  1. Chronic left-sided low back pain without sciatica -Seems to be in part muscular in origin.  We will get an x-ray of lumbar spine and left hip today.  He does have a physical therapy appointment later this morning for initial consultation. - DG Lumbar Spine Complete; Future - DG Hip Unilat W OR W/O Pelvis 2-3 Views Left; Future  Dorothyann Peng, NP

## 2022-01-15 ENCOUNTER — Encounter: Payer: Self-pay | Admitting: Adult Health

## 2022-01-16 ENCOUNTER — Other Ambulatory Visit: Payer: Self-pay | Admitting: Adult Health

## 2022-01-16 DIAGNOSIS — G8929 Other chronic pain: Secondary | ICD-10-CM

## 2022-01-19 ENCOUNTER — Telehealth: Payer: Self-pay | Admitting: Adult Health

## 2022-01-19 NOTE — Telephone Encounter (Signed)
I called patient back pertaining to a previous question, stated in a phone call with me, he had regarding his referral and cortisone shot in back. Patient did not answer phone so I left a message stating that [facility] does not do Cortisone shots in the back which is why he was referred to [facility]. Regarding the referral being sent to the [place] location, I let him know that I spoke with the referral coordinator and she stated that even though it was sent to [facility], patient should still be able to call the [place] location and schedule there with Dr.Ramos. I let patient know if he had any questions to callback and ask for [facility].     Nothing further needed at this time

## 2022-01-23 ENCOUNTER — Encounter: Payer: Self-pay | Admitting: Adult Health

## 2022-01-23 NOTE — Telephone Encounter (Signed)
Please advise 

## 2022-01-24 ENCOUNTER — Encounter: Payer: Self-pay | Admitting: Adult Health

## 2022-01-26 DIAGNOSIS — M545 Low back pain, unspecified: Secondary | ICD-10-CM | POA: Diagnosis not present

## 2022-01-29 DIAGNOSIS — M545 Low back pain, unspecified: Secondary | ICD-10-CM | POA: Diagnosis not present

## 2022-01-31 DIAGNOSIS — M545 Low back pain, unspecified: Secondary | ICD-10-CM | POA: Diagnosis not present

## 2022-02-06 DIAGNOSIS — M5416 Radiculopathy, lumbar region: Secondary | ICD-10-CM | POA: Diagnosis not present

## 2022-02-16 DIAGNOSIS — M5416 Radiculopathy, lumbar region: Secondary | ICD-10-CM | POA: Diagnosis not present

## 2022-03-01 DIAGNOSIS — M5416 Radiculopathy, lumbar region: Secondary | ICD-10-CM | POA: Diagnosis not present

## 2022-03-15 DIAGNOSIS — M419 Scoliosis, unspecified: Secondary | ICD-10-CM | POA: Diagnosis not present

## 2022-03-15 DIAGNOSIS — M5117 Intervertebral disc disorders with radiculopathy, lumbosacral region: Secondary | ICD-10-CM | POA: Diagnosis not present

## 2022-03-15 DIAGNOSIS — M5416 Radiculopathy, lumbar region: Secondary | ICD-10-CM | POA: Diagnosis not present

## 2022-03-20 ENCOUNTER — Encounter: Payer: Self-pay | Admitting: Adult Health

## 2022-03-20 DIAGNOSIS — R350 Frequency of micturition: Secondary | ICD-10-CM

## 2022-03-20 NOTE — Telephone Encounter (Signed)
Please advise 

## 2022-03-21 NOTE — Telephone Encounter (Signed)
Patient called in response to this mychart message stating he would like to go forward with a referral to urology

## 2022-03-25 ENCOUNTER — Encounter: Payer: Self-pay | Admitting: Adult Health

## 2022-03-28 ENCOUNTER — Ambulatory Visit (INDEPENDENT_AMBULATORY_CARE_PROVIDER_SITE_OTHER): Payer: Medicare HMO | Admitting: Adult Health

## 2022-03-28 ENCOUNTER — Encounter: Payer: Self-pay | Admitting: Adult Health

## 2022-03-28 VITALS — BP 120/80 | HR 72 | Temp 98.0°F | Wt 163.0 lb

## 2022-03-28 DIAGNOSIS — R351 Nocturia: Secondary | ICD-10-CM | POA: Diagnosis not present

## 2022-03-28 LAB — POCT URINALYSIS DIPSTICK
Bilirubin, UA: NEGATIVE
Blood, UA: NEGATIVE
Glucose, UA: NEGATIVE
Ketones, UA: NEGATIVE
Leukocytes, UA: NEGATIVE
Nitrite, UA: NEGATIVE
Protein, UA: NEGATIVE
Spec Grav, UA: 1.015 (ref 1.010–1.025)
Urobilinogen, UA: 0.2 E.U./dL
pH, UA: 6 (ref 5.0–8.0)

## 2022-03-28 NOTE — Progress Notes (Signed)
Subjective:    Patient ID: Kenneth York, male    DOB: 16-May-1951, 71 y.o.   MRN: [id number]  HPI 71 year old male who  has a past medical history of Allergy, Cancer ([place]), and Scoliosis of thoracic spine.  He presents to the office today for follow up regarding urinary frequency. He had a MRI done previous to micro-diskectomy and it was noted incidentally he had cysts on his bladder. He was referred to urology but has not had an appointment yet   He wants to make sure he does not have a UTI prior to leaving for [place] for 10 days.   He reports that his symptoms are more at night, when he has to get up 3-4 times a night to urinate. He also reports decreased stream and incomplete bladder emptying    Review of Systems See HPI   Past Medical History:  Diagnosis Date   Allergy    Cancer ([place])    basal cell    Scoliosis of thoracic spine     Social History   Socioeconomic History   Marital status: Married    Spouse name: Not on file   Number of children: Not on file   Years of education: Not on file   Highest education level: Not on file  Occupational History   Not on file  Tobacco Use   Smoking status: Never   Smokeless tobacco: Never  Substance and Sexual Activity   Alcohol use: Yes    Alcohol/week: 4.0 standard drinks of alcohol    Types: 4 Cans of beer per week   Drug use: No   Sexual activity: Not on file  Other Topics Concern   Not on file  Social History Narrative   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    Married    No children    Social Determinants of Health   Financial Resource Strain: Low Risk  (09/08/2021)   Overall Financial Resource Strain (CARDIA)    Difficulty of Paying Living Expenses: Not hard at all  Food Insecurity: No Food Insecurity (09/08/2021)   Hunger Vital Sign    Worried About Running Out of Food in the Last Year: Never true    [place] in the Last Year: Never true  Transportation  Needs: No Transportation Needs (09/08/2021)   PRAPARE - Hydrologist (Medical): No    Lack of Transportation (Non-Medical): No  Physical Activity: Sufficiently Active (03/27/2022)   Exercise Vital Sign    Days of Exercise per Week: 6 days    Minutes of Exercise per Session: 60 min  Stress: No Stress Concern Present (03/27/2022)   [place]    Feeling of Stress : Not at all  Social Connections: [place] (03/27/2022)   Social Connection and Isolation Panel [NHANES]    Frequency of Communication with Friends and Family: More than three times a week    Frequency of Social Gatherings with Friends and Family: More than three times a week    Attends Religious Services: More than 4 times per year    Active Member of [employer] or Organizations: Patient refused    Attends Music therapist: More than 4 times per year    Marital Status: Married  Human resources officer Violence: Not At Risk (09/08/2021)   Humiliation, Afraid, Rape, and Kick questionnaire    Fear of Current  or Ex-Partner: No    Emotionally Abused: No    Physically Abused: No    Sexually Abused: No    Past Surgical History:  Procedure Laterality Date   MOHS SURGERY  2004    Family History  Problem Relation Age of Onset   Ovarian cancer Mother    Heart disease Father    Prostate cancer Father    Colon cancer Neg Hx    Esophageal cancer Neg Hx    Rectal cancer Neg Hx    Stomach cancer Neg Hx     No Known Allergies  Current Outpatient Medications on File Prior to Visit  Medication Sig Dispense Refill   Ascorbic Acid (VITAMIN C) 100 MG tablet Take 100 mg by mouth daily.     cetirizine (ZYRTEC) 10 MG tablet      meloxicam (MOBIC) 15 MG tablet Take 15 mg by mouth every morning.     Zinc 100 MG TABS Take by mouth.     No current facility-administered medications on file prior to visit.    BP 120/80   Pulse 72    Temp 98 F (36.7 C)   Wt 163 lb (73.9 kg)   SpO2 97%   BMI 22.73 kg/m       Objective:   Physical Exam Vitals and nursing note reviewed.  Constitutional:      Appearance: Normal appearance.  Cardiovascular:     Rate and Rhythm: Normal rate and regular rhythm.  Musculoskeletal:        General: Normal range of motion.  Skin:    General: Skin is warm and dry.     Comments: Well healing surgical scar of lumbar spine   Neurological:     General: No focal deficit present.     Mental Status: He is alert and oriented to person, place, and time.  Psychiatric:        Mood and Affect: Mood normal.        Behavior: Behavior normal.        Assessment & Plan:  1. Nocturia  - POC Urinalysis Dipstick - negative  - Symptoms of BPH, advised patient that I would be happy to start him on treatment with Flomax but he would like to wait until he is seen by urology  - Follow up as needed   Dorothyann Peng, NP

## 2022-04-18 DIAGNOSIS — N401 Enlarged prostate with lower urinary tract symptoms: Secondary | ICD-10-CM | POA: Diagnosis not present

## 2022-04-18 DIAGNOSIS — R351 Nocturia: Secondary | ICD-10-CM | POA: Diagnosis not present

## 2022-04-18 DIAGNOSIS — Z8042 Family history of malignant neoplasm of prostate: Secondary | ICD-10-CM | POA: Diagnosis not present

## 2022-04-18 DIAGNOSIS — R948 Abnormal results of function studies of other organs and systems: Secondary | ICD-10-CM | POA: Diagnosis not present

## 2022-04-18 DIAGNOSIS — N281 Cyst of kidney, acquired: Secondary | ICD-10-CM | POA: Diagnosis not present

## 2022-04-18 LAB — PSA: PSA: 2

## 2022-05-03 ENCOUNTER — Encounter: Payer: Self-pay | Admitting: Adult Health

## 2022-06-15 DIAGNOSIS — N401 Enlarged prostate with lower urinary tract symptoms: Secondary | ICD-10-CM | POA: Diagnosis not present

## 2022-06-15 DIAGNOSIS — R351 Nocturia: Secondary | ICD-10-CM | POA: Diagnosis not present

## 2022-06-15 DIAGNOSIS — N281 Cyst of kidney, acquired: Secondary | ICD-10-CM | POA: Diagnosis not present

## 2022-06-21 ENCOUNTER — Encounter: Payer: Self-pay | Admitting: Adult Health

## 2022-06-21 DIAGNOSIS — M545 Low back pain, unspecified: Secondary | ICD-10-CM | POA: Diagnosis not present

## 2022-06-21 DIAGNOSIS — M25512 Pain in left shoulder: Secondary | ICD-10-CM | POA: Diagnosis not present

## 2022-06-21 DIAGNOSIS — M542 Cervicalgia: Secondary | ICD-10-CM | POA: Diagnosis not present

## 2022-07-09 ENCOUNTER — Encounter: Payer: Self-pay | Admitting: Adult Health

## 2022-07-18 ENCOUNTER — Encounter: Payer: Self-pay | Admitting: Adult Health

## 2022-07-20 NOTE — Telephone Encounter (Signed)
Chart updated

## 2022-07-24 IMAGING — MR MR PELVIS WO/W CM
8 series · 48 of 48 positions shown · IV contrast (multihance)
Comparison: None.

CLINICAL DATA: Perianal abscess or fistula suspected

EXAM:
MRI PELVIS WITHOUT AND WITH CONTRAST
TECHNIQUE: Multiplanar multisequence MR imaging of the pelvis was performed
both before and after administration of intravenous contrast.
CONTRAST:  17mL MULTIHANCE GADOBENATE DIMEGLUMINE 529 MG/ML IV SOLN

[Series 3: t2_tse_sag · sagittal · 5.0mm · 1.05mm/px · 5 of 28 slices shown]
[im 1/28]
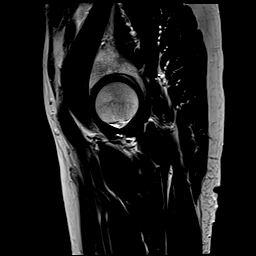
[im 7/28]
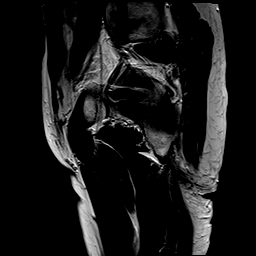
[im 14/28]
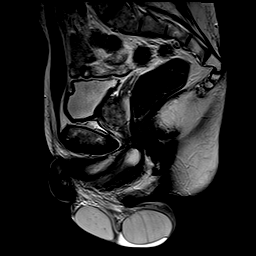
[im 21/28]
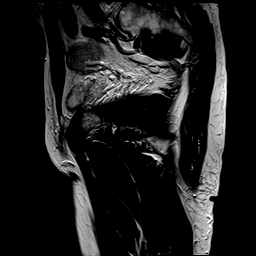
[im 28/28]
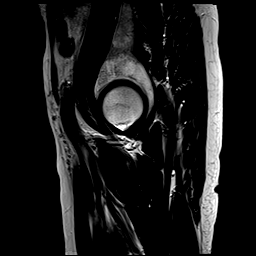

[Series 4: t2_tse_fs sag · sagittal · 5.0mm · 1.05mm/px · 5 of 28 slices shown]
[im 1/28]
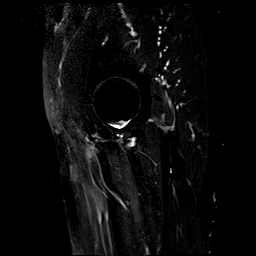
[im 7/28]
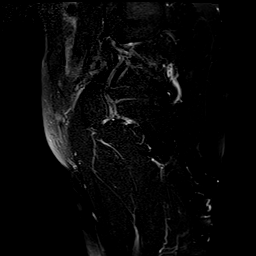
[im 14/28]
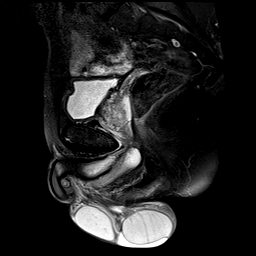
[im 21/28]
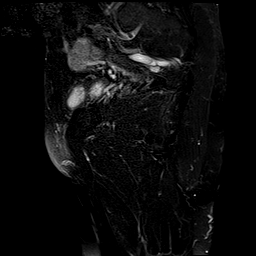
[im 28/28]
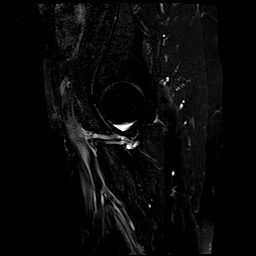

[Series 5: t1_tse axial obl · axial · 4.0mm · 0.78mm/px · z∈[-129,+66]mm · 6 of 46 slices shown (1 of 2)]
[im 1/46]
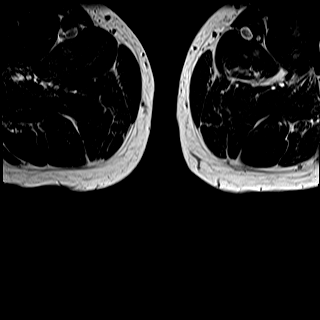
[im 10/46]
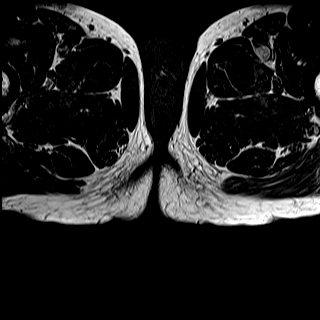
[im 19/46]
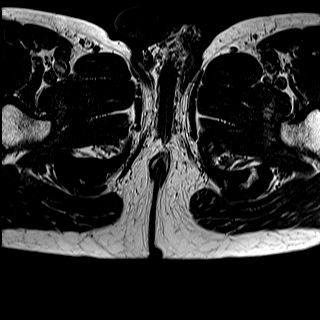
[im 28/46]
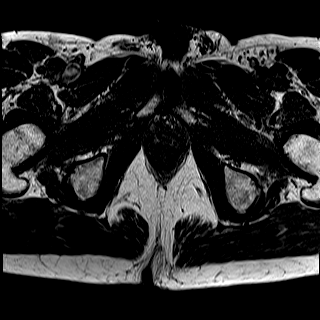
[im 37/46]
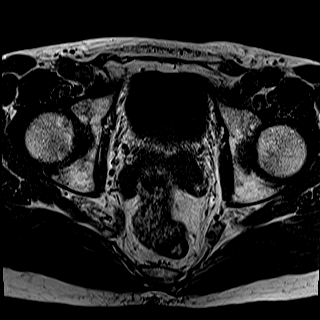
[im 46/46]
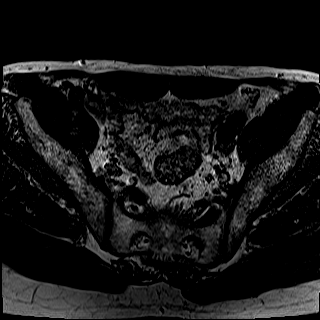

[Series 6: t2_tse axial obl · axial · 4.0mm · 0.78mm/px · z∈[-129,+66]mm · 6 of 46 slices shown (1 of 2)]
[im 1/46]
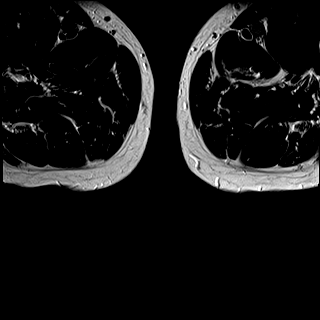
[im 10/46]
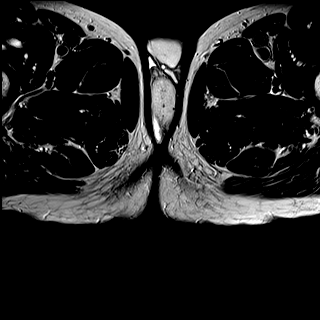
[im 19/46]
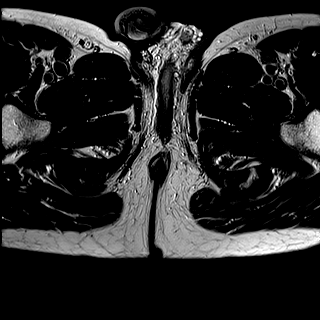
[im 28/46]
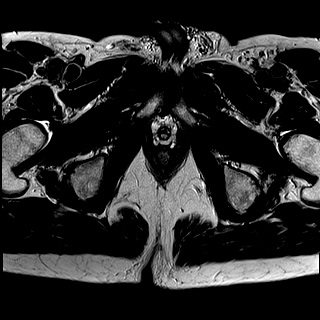
[im 37/46]
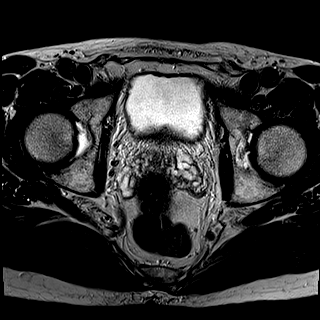
[im 46/46]
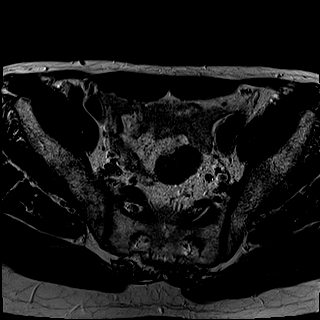

[Series 7: t2_tse axial obl · axial · 4.0mm · 0.98mm/px · z∈[-129,+66]mm · 6 of 46 slices shown (2 of 2)]
[im 1/46]
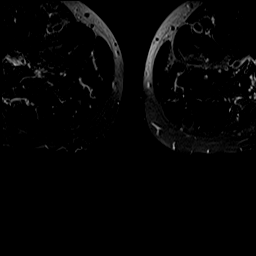
[im 10/46]
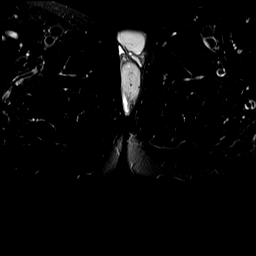
[im 19/46]
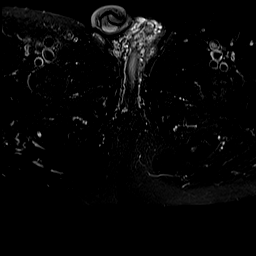
[im 28/46]
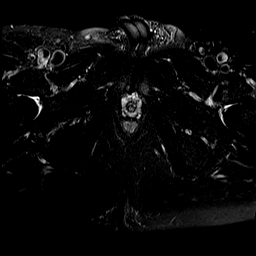
[im 37/46]
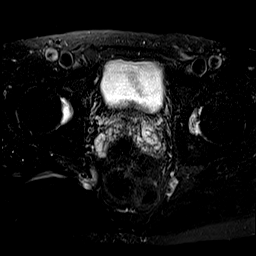
[im 46/46]
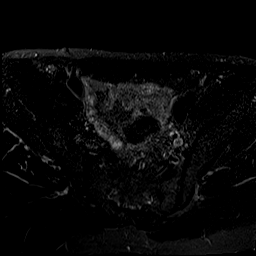

[Series 8: T2 fat-sat · coronal · 4.0mm · 0.98mm/px · 7 of 50 slices shown]
[im 1/50]
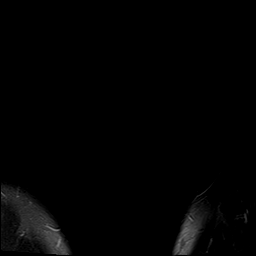
[im 9/50]
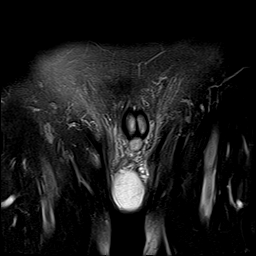
[im 17/50]
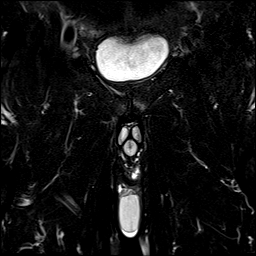
[im 25/50]
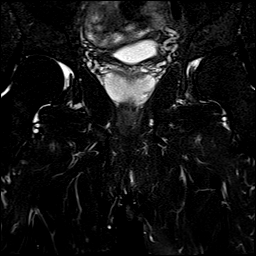
[im 33/50]
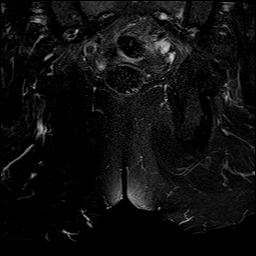
[im 41/50]
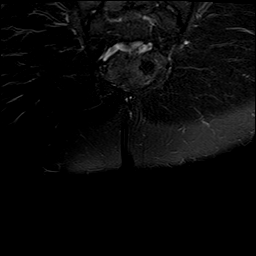
[im 50/50]
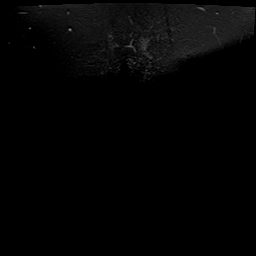

[Series 9: t1_tse axial obl · axial · 4.0mm · 0.98mm/px · z∈[-129,+66]mm · 6 of 46 slices shown (2 of 2)]
[im 1/46]
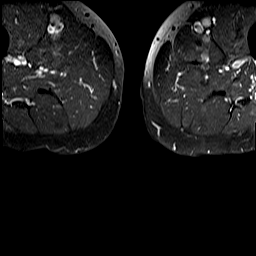
[im 10/46]
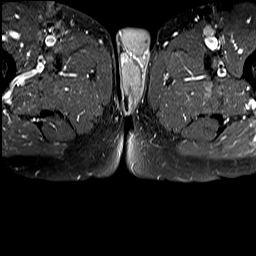
[im 19/46]
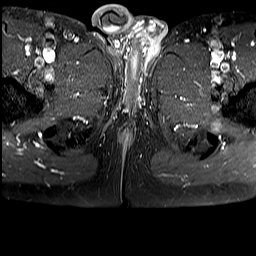
[im 28/46]
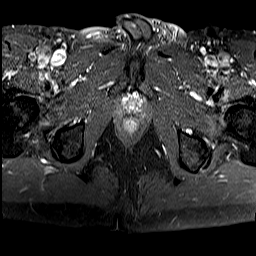
[im 37/46]
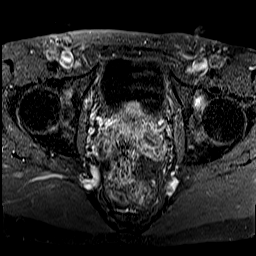
[im 46/46]
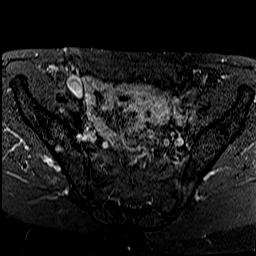

[Series 10: post t1_tse cor · coronal · 4.0mm · 0.78mm/px · 7 of 50 slices shown]
[im 1/50]
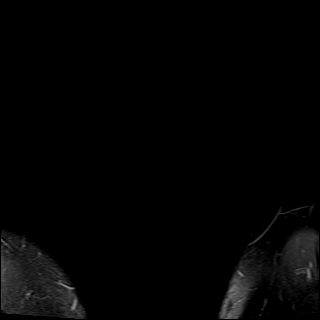
[im 9/50]
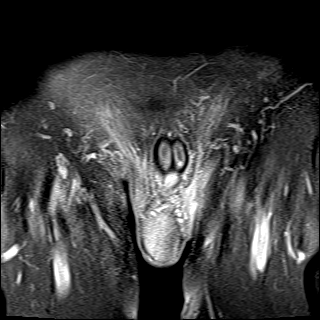
[im 17/50]
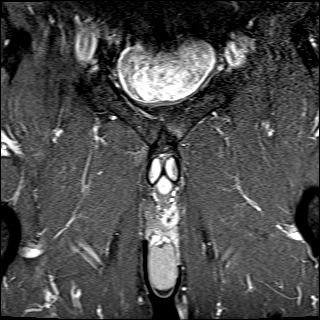
[im 25/50]
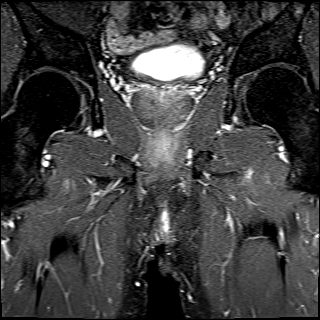
[im 33/50]
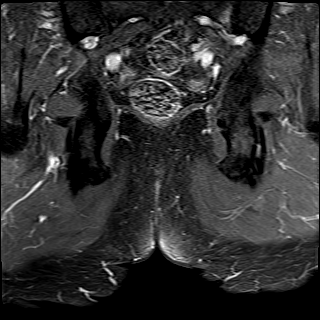
[im 41/50]
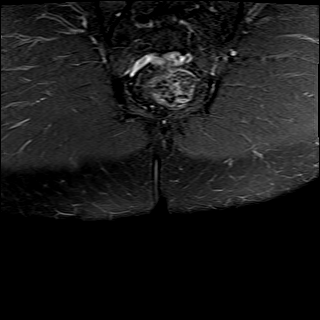
[im 50/50]
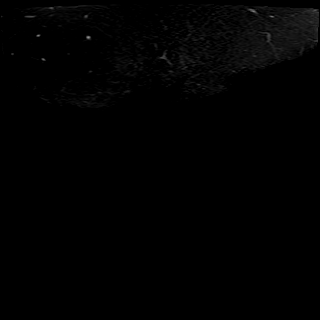

[48 of 48 positions shown; findings below may reference images not displayed]

FINDINGS: Urinary Tract:  No abnormality visualized.

Bowel:  Unremarkable visualized pelvic bowel loops.

Vascular/Lymphatic: No pathologically enlarged lymph nodes. No
significant vascular abnormality seen.

Reproductive:  No mass or other significant abnormality

Other: There is an elongated, rim enhancing fluid collection or
tract within the most inferior left inguinal crease, adjacent to the
left scrotum and penile base, extending to the vicinity of the left
aspect of the anal verge and measuring approximately 8.9 x 1.0 cm
(series 9, image 32).

Musculoskeletal: No suspicious bone lesions identified.
IMPRESSION: Elongated, rim enhancing fluid collection or tract within the most
inferior left inguinal crease, adjacent to the left scrotum and
penile base, extending to the vicinity of the left aspect of the
anal verge and measuring approximately 8.9 x 1.0 cm. This does not
appear to involve the anal sphincters and would generally be an
unusual location and configuration for a perianal fistula tract.
This may be of cutaneous origin arising from the skin of the scrotum
or groin.

## 2022-09-17 DIAGNOSIS — L814 Other melanin hyperpigmentation: Secondary | ICD-10-CM | POA: Diagnosis not present

## 2022-09-17 DIAGNOSIS — L821 Other seborrheic keratosis: Secondary | ICD-10-CM | POA: Diagnosis not present

## 2022-09-17 DIAGNOSIS — D0461 Carcinoma in situ of skin of right upper limb, including shoulder: Secondary | ICD-10-CM | POA: Diagnosis not present

## 2022-09-17 DIAGNOSIS — D2239 Melanocytic nevi of other parts of face: Secondary | ICD-10-CM | POA: Diagnosis not present

## 2022-09-17 DIAGNOSIS — D225 Melanocytic nevi of trunk: Secondary | ICD-10-CM | POA: Diagnosis not present

## 2022-09-17 DIAGNOSIS — L570 Actinic keratosis: Secondary | ICD-10-CM | POA: Diagnosis not present

## 2022-09-17 DIAGNOSIS — D2262 Melanocytic nevi of left upper limb, including shoulder: Secondary | ICD-10-CM | POA: Diagnosis not present

## 2022-09-17 DIAGNOSIS — M720 Palmar fascial fibromatosis [Dupuytren]: Secondary | ICD-10-CM | POA: Diagnosis not present

## 2022-09-17 DIAGNOSIS — D3617 Benign neoplasm of peripheral nerves and autonomic nervous system of trunk, unspecified: Secondary | ICD-10-CM | POA: Diagnosis not present

## 2022-09-20 ENCOUNTER — Telehealth: Payer: Self-pay | Admitting: Adult Health

## 2022-09-20 NOTE — Telephone Encounter (Signed)
Left message for patient to call back and schedule Medicare Annual Wellness Visit (AWV) either virtually or in office. Left  my Herbie Drape number [phone]   Last AWV 09/08/21 please schedule with Nurse Health Adviser   45 min for awv-i  in office appointments 30 min for awv-s & awv-i phone/virtual appointments

## 2022-10-04 ENCOUNTER — Telehealth (INDEPENDENT_AMBULATORY_CARE_PROVIDER_SITE_OTHER): Payer: Medicare HMO | Admitting: Family Medicine

## 2022-10-04 DIAGNOSIS — Z0000 Encounter for general adult medical examination without abnormal findings: Secondary | ICD-10-CM

## 2022-10-04 NOTE — Progress Notes (Signed)
PATIENT CHECK-IN and HEALTH RISK ASSESSMENT QUESTIONNAIRE:  -completed by phone/video for upcoming Medicare Preventive Visit  Pre-Visit Check-in: 1)Vitals (height, wt, BP, etc) - record in vitals section for visit on day of visit 2)Review and Update Medications, Allergies PMH, Surgeries, Social history in Epic 3)Hospitalizations in the last year with date/reason? No  4)Review and Update Care Team (patient's specialists) in Epic 5) Complete PHQ9 in Epic  6) Complete Fall Screening in Epic 7)Review all Health Maintenance Due and order under PCP if not done.  Medicare Wellness Patient Questionnaire:  Answer theses question about your habits: Do you drink alcohol? Yes  If yes, how many drinks do you have a day?Social drinker have a few a week - a couple drinks at most, on occasion Have you ever smoked?No  Quit date if applicable? N/A  How many packs a day do/did you smoke? N/A Do you use smokeless tobacco?No Do you use an illicit drugs?No  Do you exercises? Yes IF so, what type and how many days/minutes per week?6 times, walk for 1 hour, Gym for 30 or 40 mins, balance exercises too  Are you sexually active? Yes Number of partners?1 Typical breakfast: Cereal with fruit, half grapefruit, toast, eggs, sausage  Typical lunch: sandwich, salad, fruit Typical dinner: soup, spaghetti, salad, shrimp Typical snacks: Nuts  Beverages: Water, coffee  Answer theses question about you: Can you perform most household chores?Yes Do you find it hard to follow a conversation in a noisy room?No Do you often ask people to speak up or repeat themselves? No Do you feel that you have a problem with memory?No Do you balance your checkbook and or bank acounts?Yes Do you feel safe at home?Yes Last dentist visit?2 weeks ago Do you need assistance with any of the following: Please note if so No assistance needed with below  Driving?  Feeding yourself?  Getting from bed to chair?  Getting to the  toilet?  Bathing or showering?  Dressing yourself?  Managing money?  Climbing a flight of stairs  Preparing meals?    Do you have Advanced Directives in place (Living Will, Healthcare Power or Attorney)? Yes   Last eye Exam and location?1 year ago.  [facility]   Do you currently use prescribed or non-prescribed narcotic or opioid pain medications?No   Do you have a history or close family history of breast, ovarian, tubal or peritoneal cancer or a family member with BRCA (breast cancer susceptibility 1 and 2) gene mutations?  Nurse/Assistant Credentials/time stamp:[facility]    ----------------------------------------------------------------------------------------------------------------------------------------------------------------------------------------------------------------------    MEDICARE ANNUAL PREVENTIVE CARE VISIT WITH PROVIDER (Welcome to Medicare, initial annual wellness or annual wellness exam)  Virtual Visit via Video Note  I connected with Kenneth York on 10/04/22  by a video enabled telemedicine application and verified that I am speaking with the correct person using two identifiers.  Location patient: home Location provider:work or home office Persons participating in the virtual visit: patient, provider  Concerns and/or follow up today: reports doing great, stable.    See HM section in Epic for other details of completed HM.    ROS: negative for report of fevers, unintentional weight loss, vision changes, vision loss, hearing loss or change, chest pain, sob, hemoptysis, melena, hematochezia, hematuria, genital discharge or lesions, falls, bleeding or bruising, loc, thoughts of suicide or self harm, memory loss  Patient-completed extensive health risk assessment - reviewed and discussed with the patient: See Health Risk Assessment completed with patient prior to the visit either above or in  recent phone note. This was reviewed in detailed with the  patient today and appropriate recommendations, orders and referrals were placed as needed per Summary below and patient instructions.   Review of Medical History: -PMH, PSH, Family History and current specialty and care providers reviewed and updated and listed below   Patient Care Team: Dorothyann Peng, NP as PCP - General (Family Medicine) Lavonna Monarch, MD (Inactive) as Consulting Physician (Dermatology)   Past Medical History:  Diagnosis Date   Allergy    Cancer [facility])    basal cell    Scoliosis of thoracic spine     Past Surgical History:  Procedure Laterality Date   MOHS SURGERY  2004    Social History   Socioeconomic History   Marital status: Married    Spouse name: Not on file   Number of children: Not on file   Years of education: Not on file   Highest education level: Not on file  Occupational History   Not on file  Tobacco Use   Smoking status: Never   Smokeless tobacco: Never  Substance and Sexual Activity   Alcohol use: Yes    Alcohol/week: 4.0 standard drinks of alcohol    Types: 4 Cans of beer per week   Drug use: No   Sexual activity: Not on file  Other Topics Concern   Not on file  Social History Narrative   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    Married    No children    Social Determinants of Health   Financial Resource Strain: Low Risk  (09/08/2021)   Overall Financial Resource Strain (CARDIA)    Difficulty of Paying Living Expenses: Not hard at all  Food Insecurity: No Food Insecurity (09/08/2021)   Hunger Vital Sign    Worried About Running Out of Food in the Last Year: Never true    [place] in the Last Year: Never true  Transportation Needs: No Transportation Needs (09/08/2021)   PRAPARE - Hydrologist (Medical): No    Lack of Transportation (Non-Medical): No  Physical Activity: Sufficiently Active (03/27/2022)   Exercise Vital Sign    Days of Exercise per  Week: 6 days    Minutes of Exercise per Session: 60 min  Stress: No Stress Concern Present (03/27/2022)   [place]    Feeling of Stress : Not at all  Social Connections: Unknown (03/27/2022)   Social Connection and Isolation Panel [NHANES]    Frequency of Communication with Friends and Family: Not on file    Frequency of Social Gatherings with Friends and Family: Not on file    Attends Religious Services: Not on file    Active Member of Clubs or Organizations: Patient refused    Attends Archivist Meetings: Not on file    Marital Status: Not on file  Intimate Partner Violence: Not At Risk (09/08/2021)   Humiliation, Afraid, Rape, and Kick questionnaire    Fear of Current or Ex-Partner: No    Emotionally Abused: No    Physically Abused: No    Sexually Abused: No    Family History  Problem Relation Age of Onset   Ovarian cancer Mother    Heart disease Father    Prostate cancer Father    Colon cancer Neg Hx    Esophageal cancer Neg Hx    Rectal cancer Neg Hx  Stomach cancer Neg Hx     Current Outpatient Medications on File Prior to Visit  Medication Sig Dispense Refill   Ascorbic Acid (VITAMIN C) 100 MG tablet Take 100 mg by mouth daily.     cetirizine (ZYRTEC) 10 MG tablet      meloxicam (MOBIC) 15 MG tablet Take 15 mg by mouth every morning.     Zinc 100 MG TABS Take by mouth.     No current facility-administered medications on file prior to visit.    No Known Allergies     Physical Exam There were no vitals filed for this visit. Estimated body mass index is 22.73 kg/m as calculated from the following:   Height as of 01/11/22: 5' 11"$  (1.803 m).   Weight as of 03/28/22: 163 lb (73.9 kg).  EKG (optional): deferred due to virtual visit  GENERAL: alert, oriented, no acute distress detected; full vision exam deferred due to pandemic and/or virtual encounter  HEENT: atraumatic, conjunttiva  clear, no obvious abnormalities on inspection of external nose and ears  NECK: normal movements of the head and neck  LUNGS: on inspection no signs of respiratory distress, breathing rate appears normal, no obvious gross SOB, gasping or wheezing  CV: no obvious cyanosis  MS: moves all visible extremities without noticeable abnormality  PSYCH/NEURO: pleasant and cooperative, no obvious depression or anxiety, speech and thought processing grossly intact, Cognitive function grossly intact  Flowsheet Row Video Visit from 10/04/2022 in [place] at [place]  PHQ-9 Total Score 0           10/04/2022    5:14 PM 01/11/2022    7:28 AM 09/08/2021   11:15 AM 10/21/2020    3:13 PM  Depression screen PHQ 2/9  Decreased Interest 0 0 0 0  Down, Depressed, Hopeless 0 0 0 0  PHQ - 2 Score 0 0 0 0  Altered sleeping 0 0    Tired, decreased energy 0 0    Change in appetite 0 0    Feeling bad or failure about yourself  0 0    Trouble concentrating 0 0    Moving slowly or fidgety/restless 0 0    Suicidal thoughts 0 0    PHQ-9 Score 0 0    Difficult doing work/chores Not difficult at all Not difficult at all         11/25/2020    6:24 AM 09/08/2021   11:18 AM 10/04/2022    5:14 PM  Fall Risk  Falls in the past year?  0 0  Was there an injury with Fall?  0 0  Fall Risk Category Calculator  0 0  Fall Risk Category (Retired)  Low   (RETIRED) Patient Fall Risk Level Low fall risk Low fall risk      SUMMARY AND PLAN:  Encounter for Medicare annual wellness exam    Discussed applicable health maintenance/preventive health measures and advised and referred or ordered per patient preferences:  Health Maintenance  Topic Date Due   DTaP/Tdap/Td (3 - Tdap) 07/15/2019 - he thinks he may have had this and plans to check, agrees to let us knows so that we can update   COVID-19 Vaccine (4 - 2023-24 season) 04/13/2022, considering    Medicare Annual Wellness (AWV)  10/05/2023    COLONOSCOPY (Pts 45-22yr Insurance coverage will need to be confirmed)  02/17/2025   Pneumonia Vaccine 72 Years old  Completed   INFLUENZA VACCINE  Completed   Hepatitis C Screening  Completed   Zoster Vaccines- Shingrix  Completed   HPV VACCINES  Aged Out   Does PSA with PCP.   Education and counseling on the following was provided based on the above review of health and a plan/checklist for the patient, along with additional information discussed, was provided for the patient in the patient instructions :  -congratulated on meeting exercise guidelines and advised to continue -eats fairly healthy, encouraged to continue and shoot for 5-6 servings of fruits/veggies per day - detailed diet info provided in patient instructions -Advise yearly dental visits at minimum and regular eye exams -Advised and counseled on alcohol Follow up: see patient instructions   Patient Instructions  I really enjoyed getting to talk with you today! I am available on Tuesdays and Thursdays for virtual visits if you have any questions or concerns, or if I can be of any further assistance.   CHECKLIST FROM ANNUAL WELLNESS VISIT:  -Follow up (please call to schedule if not scheduled after visit):   -yearly for annual wellness visit with primary care office  Here is a list of your preventive care/health maintenance measures and the plan for each if any are due:  Health Maintenance  Topic Date Due   DTaP/Tdap/Td (3 - Tdap) 07/15/2019   COVID-19 Vaccine (4 - 2023-24 season) 04/13/2022   Medicare Annual Wellness (AWV)  10/05/2023   COLONOSCOPY (Pts 45-63yr Insurance coverage will need to be confirmed)  02/17/2025   Pneumonia Vaccine 72 Years old  Completed   INFLUENZA VACCINE  Completed   Hepatitis C Screening  Completed   Zoster Vaccines- Shingrix  Completed   HPV VACCINES  Aged Out    -See a dentist at least yearly  -Get your eyes checked and then per your eye specialist's  recommendations  -Other issues addressed today:   -I have included below further information regarding a healthy whole foods based diet and physical activity guidelines for adults. I hope you find this information useful.   -----------------------------------------------------------------------------------------------------------------------------------------------------------------------------------------------------------------------------------------------------------  NUTRITION: -eat real food: lots of colorful vegetables (half the plate) and fruits -5-7 servings of vegetables and fruits per day (fresh or steamed is best), exp. 2 servings of vegetables with lunch and dinner and 2 servings of fruit per day. Berries and greens such as kale and collards are great choices.  -consume on a regular basis: whole grains (make sure first ingredient on label contains the word "whole"), fresh fruits, fish, nuts, seeds, healthy oils (such as olive oil, avocado oil, grape seed oil) -may eat small amounts of dairy and lean meat on occasion, but avoid processed meats such as ham, bacon, lunch meat, etc. -drink water -try to avoid fast food and pre-packaged foods, processed meat -most experts advise limiting sodium to < 23073mper day, should limit further is any chronic conditions such as high blood pressure, heart disease, diabetes, etc. The American Heart Association advised that < 150029ms is ideal -try to avoid foods that contain any ingredients with names you do not recognize  -try to avoid sugar/sweets (except for the natural sugar that occurs in fresh fruit) -try to avoid sweet drinks -try to avoid white rice, white bread, pasta (unless whole grain), white or yellow potatoes  EXERCISE GUIDELINES FOR ADULTS: -if you wish to increase your physical activity, do so gradually and with the approval of your doctor -STOP and seek medical care immediately if you have any chest pain, chest discomfort or  trouble breathing when starting or increasing exercise  -move and stretch your  body, legs, feet and arms when sitting for long periods -Physical activity guidelines for optimal health in adults: -least 150 minutes per week of aerobic exercise (can talk, but not sing) once approved by your doctor, 20-30 minutes of sustained activity or two 10 minute episodes of sustained activity every day.  -resistance training at least 2 days per week if approved by your doctor -balance exercises 3+ days per week:   Stand somewhere where you have something sturdy to hold onto if you lose balance.    1) lift up on toes, start with 5x per day and work up to 20x   2) stand and lift on leg straight out to the side so that foot is a few inches of the floor, start with 5x each side and work up to 20x each side   3) stand on one foot, start with 5 seconds each side and work up to 20 seconds on each side                Lucretia Kern, DO

## 2022-10-04 NOTE — Patient Instructions (Addendum)
I really enjoyed getting to talk with you today! I am available on Tuesdays and Thursdays for virtual visits if you have any questions or concerns, or if I can be of any further assistance.   CHECKLIST FROM ANNUAL WELLNESS VISIT:  -Follow up (please call to schedule if not scheduled after visit):   -yearly for annual wellness visit with primary care office  Here is a list of your preventive care/health maintenance measures and the plan for each if any are due:  Health Maintenance  Topic Date Due   DTaP/Tdap/Td (3 - Tdap) 07/15/2019   COVID-19 Vaccine (4 - 2023-24 season) 04/13/2022   Medicare Annual Wellness (AWV)  10/05/2023   COLONOSCOPY (Pts 45-15yr Insurance coverage will need to be confirmed)  02/17/2025   Pneumonia Vaccine 72 Years old  Completed   INFLUENZA VACCINE  Completed   Hepatitis C Screening  Completed   Zoster Vaccines- Shingrix  Completed   HPV VACCINES  Aged Out    -See a dentist at least yearly  -Get your eyes checked and then per your eye specialist's recommendations  -Other issues addressed today:   -I have included below further information regarding a healthy whole foods based diet and physical activity guidelines for adults. I hope you find this information useful.   -----------------------------------------------------------------------------------------------------------------------------------------------------------------------------------------------------------------------------------------------------------  NUTRITION: -eat real food: lots of colorful vegetables (half the plate) and fruits -5-7 servings of vegetables and fruits per day (fresh or steamed is best), exp. 2 servings of vegetables with lunch and dinner and 2 servings of fruit per day. Berries and greens such as kale and collards are great choices.  -consume on a regular basis: whole grains (make sure first ingredient on label contains the word "whole"), fresh fruits, fish, nuts, seeds,  healthy oils (such as olive oil, avocado oil, grape seed oil) -may eat small amounts of dairy and lean meat on occasion, but avoid processed meats such as ham, bacon, lunch meat, etc. -drink water -try to avoid fast food and pre-packaged foods, processed meat -most experts advise limiting sodium to < 23041mper day, should limit further is any chronic conditions such as high blood pressure, heart disease, diabetes, etc. The American Heart Association advised that < 150035ms is ideal -try to avoid foods that contain any ingredients with names you do not recognize  -try to avoid sugar/sweets (except for the natural sugar that occurs in fresh fruit) -try to avoid sweet drinks -try to avoid white rice, white bread, pasta (unless whole grain), white or yellow potatoes  EXERCISE GUIDELINES FOR ADULTS: -if you wish to increase your physical activity, do so gradually and with the approval of your doctor -STOP and seek medical care immediately if you have any chest pain, chest discomfort or trouble breathing when starting or increasing exercise  -move and stretch your body, legs, feet and arms when sitting for long periods -Physical activity guidelines for optimal health in adults: -least 150 minutes per week of aerobic exercise (can talk, but not sing) once approved by your doctor, 20-30 minutes of sustained activity or two 10 minute episodes of sustained activity every day.  -resistance training at least 2 days per week if approved by your doctor -balance exercises 3+ days per week:   Stand somewhere where you have something sturdy to hold onto if you lose balance.    1) lift up on toes, start with 5x per day and work up to 20x   2) stand and lift on leg straight out to the side  so that foot is a few inches of the floor, start with 5x each side and work up to 20x each side   3) stand on one foot, start with 5 seconds each side and work up to 20 seconds on each side

## 2022-11-13 ENCOUNTER — Ambulatory Visit (INDEPENDENT_AMBULATORY_CARE_PROVIDER_SITE_OTHER): Payer: Medicare HMO | Admitting: Adult Health

## 2022-11-13 ENCOUNTER — Encounter: Payer: Self-pay | Admitting: Adult Health

## 2022-11-13 VITALS — BP 130/100 | HR 85 | Temp 99.4°F | Ht 71.0 in | Wt 173.0 lb

## 2022-11-13 DIAGNOSIS — J301 Allergic rhinitis due to pollen: Secondary | ICD-10-CM

## 2022-11-13 MED ORDER — FLUTICASONE PROPIONATE 50 MCG/ACT NA SUSP: 2.0000 | Freq: Every day | NASAL | 2 refills | Status: DC

## 2022-11-13 NOTE — Progress Notes (Addendum)
Subjective:    Patient ID: Kenneth York, male    DOB: 1951-04-11, 72 y.o.   MRN: [id number]  HPI 72 year old male who  has a past medical history of Allergy, Cancer ([place]), and Scoliosis of thoracic spine.  He presents to the office today for an acute visit. He reports that his symptoms started a few weeks ago. Symptoms include that of itchy watery eyes, sinus congestion, semi productive cough.  He has been taking his seasonal allergy medication of zyrtec without much improvement. He has been working outside more often as a Geneticist, molecular for a Microbiologist.   He has not had any fevers, chills, shortness of breath, or wheezing.   Review of Systems See HPI   Past Medical History:  Diagnosis Date   Allergy    Cancer ([place])    basal cell    Scoliosis of thoracic spine     Social History   Socioeconomic History   Marital status: Married    Spouse name: Not on file   Number of children: Not on file   Years of education: Not on file   Highest education level: Not on file  Occupational History   Not on file  Tobacco Use   Smoking status: Never   Smokeless tobacco: Never  Substance and Sexual Activity   Alcohol use: Yes    Alcohol/week: 4.0 standard drinks of alcohol    Types: 4 Cans of beer per week   Drug use: No   Sexual activity: Not on file  Other Topics Concern   Not on file  Social History Narrative   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    Married    No children    Social Determinants of Health   Financial Resource Strain: Low Risk  (09/08/2021)   Overall Financial Resource Strain (CARDIA)    Difficulty of Paying Living Expenses: Not hard at all  Food Insecurity: No Food Insecurity (09/08/2021)   Hunger Vital Sign    Worried About Running Out of Food in the Last Year: Never true    [place] in the Last Year: Never true  Transportation Needs: No Transportation Needs (09/08/2021)   PRAPARE -  Hydrologist (Medical): No    Lack of Transportation (Non-Medical): No  Physical Activity: Sufficiently Active (03/27/2022)   Exercise Vital Sign    Days of Exercise per Week: 6 days    Minutes of Exercise per Session: 60 min  Stress: No Stress Concern Present (03/27/2022)   [place]    Feeling of Stress : Not at all  Social Connections: Unknown (03/27/2022)   Social Connection and Isolation Panel [NHANES]    Frequency of Communication with Friends and Family: Not on file    Frequency of Social Gatherings with Friends and Family: Not on file    Attends Religious Services: Not on file    Active Member of Clubs or Organizations: Patient declined    Attends Archivist Meetings: Not on file    Marital Status: Not on file  Intimate Partner Violence: Not At Risk (09/08/2021)   Humiliation, Afraid, Rape, and Kick questionnaire    Fear of Current or Ex-Partner: No    Emotionally Abused: No    Physically Abused: No    Sexually Abused: No    Past Surgical History:  Procedure Laterality Date  MOHS SURGERY  2004    Family History  Problem Relation Age of Onset   Ovarian cancer Mother    Heart disease Father    Prostate cancer Father    Colon cancer Neg Hx    Esophageal cancer Neg Hx    Rectal cancer Neg Hx    Stomach cancer Neg Hx     No Known Allergies  Current Outpatient Medications on File Prior to Visit  Medication Sig Dispense Refill   Ascorbic Acid (VITAMIN C) 100 MG tablet Take 100 mg by mouth daily.     cetirizine (ZYRTEC) 10 MG tablet      Zinc 100 MG TABS Take by mouth.     No current facility-administered medications on file prior to visit.    BP (!) 130/100   Pulse 85   Temp 99.4 F (37.4 C) (Oral)   Ht 5\' 11"  (1.803 m)   Wt 173 lb (78.5 kg)   SpO2 98%   BMI 24.13 kg/m       Objective:   Physical Exam Vitals and nursing note reviewed.   Constitutional:      Appearance: Normal appearance.  HENT:     Nose: Congestion present. No rhinorrhea.     Right Turbinates: Not enlarged or swollen.     Left Turbinates: Not enlarged or swollen.  Cardiovascular:     Rate and Rhythm: Normal rate and regular rhythm.     Pulses: Normal pulses.     Heart sounds: Normal heart sounds.  Pulmonary:     Effort: Pulmonary effort is normal.     Breath sounds: Normal breath sounds.  Musculoskeletal:        General: Normal range of motion.  Skin:    General: Skin is warm and dry.  Neurological:     General: No focal deficit present.     Mental Status: He is alert and oriented to person, place, and time.  Psychiatric:        Mood and Affect: Mood normal.        Behavior: Behavior normal.        Thought Content: Thought content normal.       Assessment & Plan:  1. Seasonal allergic rhinitis due to pollen - Continue with Zyrtec and start flonase. We discussed prednisone today but he would rather not take it.  - fluticasone (FLONASE) 50 MCG/ACT nasal spray; Place 2 sprays into both nostrils daily.  Dispense: 16 g; Refill: 2  Dorothyann Peng, NP

## 2022-12-14 DIAGNOSIS — H2513 Age-related nuclear cataract, bilateral: Secondary | ICD-10-CM | POA: Diagnosis not present

## 2022-12-14 DIAGNOSIS — H40013 Open angle with borderline findings, low risk, bilateral: Secondary | ICD-10-CM | POA: Diagnosis not present

## 2022-12-14 DIAGNOSIS — H11152 Pinguecula, left eye: Secondary | ICD-10-CM | POA: Diagnosis not present

## 2022-12-14 DIAGNOSIS — D492 Neoplasm of unspecified behavior of bone, soft tissue, and skin: Secondary | ICD-10-CM | POA: Diagnosis not present

## 2023-05-28 ENCOUNTER — Ambulatory Visit (INDEPENDENT_AMBULATORY_CARE_PROVIDER_SITE_OTHER): Payer: Medicare HMO | Admitting: Adult Health

## 2023-05-28 VITALS — BP 110/80 | HR 65 | Temp 98.8°F | Ht 71.0 in | Wt 170.0 lb

## 2023-05-28 DIAGNOSIS — J988 Other specified respiratory disorders: Secondary | ICD-10-CM

## 2023-05-28 NOTE — Progress Notes (Signed)
Subjective:    Patient ID: Kenneth York, male    DOB: 09/29/1950, 72 y.o.   MRN: [id number]  HPI  72 year old male who  has a past medical history of Allergy, Cancer (HCC), and Scoliosis of thoracic spine.  He is being today for an acute issue.  He reports that his symptoms started around 11 days ago.  Symptoms include reductive cough, chest and head congestion and feeling swimmy headed from time to time.  He has not had any fevers, chills, shortness of breath, or wheezing.  He reports that his symptoms have been improving and he is about 60% better than he was last week.  He has not been using much at home besides Zyrtec and Flonase.  Review of Systems See HPI   Past Medical History:  Diagnosis Date   Allergy    Cancer (HCC)    basal cell    Scoliosis of thoracic spine     Social History   Socioeconomic History   Marital status: Married    Spouse name: Not on file   Number of children: Not on file   Years of education: Not on file   Highest education level: Bachelor's degree (e.g., BA, AB, BS)  Occupational History   Not on file  Tobacco Use   Smoking status: Never   Smokeless tobacco: Never  Substance and Sexual Activity   Alcohol use: Yes    Alcohol/week: 4.0 standard drinks of alcohol    Types: 4 Cans of beer per week   Drug use: No   Sexual activity: Not on file  Other Topics Concern   Not on file  Social History Narrative   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    Married    No children    Social Determinants of Health   Financial Resource Strain: Low Risk  (05/28/2023)   Overall Financial Resource Strain (CARDIA)    Difficulty of Paying Living Expenses: Not hard at all  Food Insecurity: No Food Insecurity (05/28/2023)   Hunger Vital Sign    Worried About Running Out of Food in the Last Year: Never true    Ran Out of Food in the Last Year: Never true  Transportation Needs: No Transportation Needs  (05/28/2023)   PRAPARE - Administrator, Civil Service (Medical): No    Lack of Transportation (Non-Medical): No  Physical Activity: Sufficiently Active (05/28/2023)   Exercise Vital Sign    Days of Exercise per Week: 6 days    Minutes of Exercise per Session: 60 min  Stress: No Stress Concern Present (05/28/2023)   [employer] of Occupational Health - Occupational Stress Questionnaire    Feeling of Stress : Not at all  Social Connections: Socially Integrated (05/28/2023)   Social Connection and Isolation Panel [NHANES]    Frequency of Communication with Friends and Family: More than three times a week    Frequency of Social Gatherings with Friends and Family: More than three times a week    Attends Religious Services: More than 4 times per year    Active Member of [employer] or Organizations: Yes    Attends Banker Meetings: More than 4 times per year    Marital Status: Married  Catering manager Violence: Not At Risk (09/08/2021)   Humiliation, Afraid, Rape, and Kick questionnaire    Fear of Current or Ex-Partner: No    Emotionally Abused: No    Physically  Abused: No    Sexually Abused: No    Past Surgical History:  Procedure Laterality Date   MOHS SURGERY  2004    Family History  Problem Relation Age of Onset   Ovarian cancer Mother    Heart disease Father    Prostate cancer Father    Colon cancer Neg Hx    Esophageal cancer Neg Hx    Rectal cancer Neg Hx    Stomach cancer Neg Hx     No Known Allergies  Current Outpatient Medications on File Prior to Visit  Medication Sig Dispense Refill   Ascorbic Acid (VITAMIN C) 100 MG tablet Take 100 mg by mouth daily.     cetirizine (ZYRTEC) 10 MG tablet      fluticasone (FLONASE) 50 MCG/ACT nasal spray Place 2 sprays into both nostrils daily. 16 g 2   Zinc 100 MG TABS Take by mouth.     No current facility-administered medications on file prior to visit.    BP 110/80   Pulse 65   Temp 98.8 F  (37.1 C) (Oral)   Ht 5\' 11"  (1.803 m)   Wt 170 lb (77.1 kg)   SpO2 95%   BMI 23.71 kg/m       Objective:   Physical Exam Vitals and nursing note reviewed.  Constitutional:      Appearance: Normal appearance.  HENT:     Right Ear: Hearing, tympanic membrane, ear canal and external ear normal.     Left Ear: Hearing, tympanic membrane, ear canal and external ear normal.     Nose: No congestion or rhinorrhea.     Right Turbinates: Not enlarged.     Left Turbinates: Not enlarged.     Right Sinus: No maxillary sinus tenderness or frontal sinus tenderness.     Left Sinus: No maxillary sinus tenderness or frontal sinus tenderness.     Mouth/Throat:     Lips: Pink.     Mouth: Mucous membranes are moist.     Pharynx: Oropharynx is clear. Uvula midline.  Cardiovascular:     Rate and Rhythm: Normal rate and regular rhythm.     Pulses: Normal pulses.     Heart sounds: Normal heart sounds.  Pulmonary:     Effort: Pulmonary effort is normal.     Breath sounds: Normal breath sounds.  Musculoskeletal:        General: Normal range of motion.  Skin:    General: Skin is warm and dry.     Capillary Refill: Capillary refill takes less than 2 seconds.  Neurological:     General: No focal deficit present.     Mental Status: He is alert and oriented to person, place, and time.  Psychiatric:        Mood and Affect: Mood normal.        Behavior: Behavior normal.        Thought Content: Thought content normal.        Judgment: Judgment normal.       Assessment & Plan:  1. Respiratory infection -Likely allergy mediated  or he is at the tail end of a viral infection.  Advised to continue to use Zyrtec and Flonase.  Follow-up if symptoms continue over the next week without getting any better.  Shirline Frees, NP

## 2023-07-08 DIAGNOSIS — N401 Enlarged prostate with lower urinary tract symptoms: Secondary | ICD-10-CM | POA: Diagnosis not present

## 2023-07-08 DIAGNOSIS — R3911 Hesitancy of micturition: Secondary | ICD-10-CM | POA: Diagnosis not present

## 2023-08-04 ENCOUNTER — Encounter: Payer: Self-pay | Admitting: Family Medicine

## 2023-10-17 ENCOUNTER — Telehealth: Payer: Self-pay | Admitting: *Deleted

## 2023-10-17 NOTE — Telephone Encounter (Signed)
 Copied from CRM [id number]. Topic: General - Other >> Oct 17, 2023  1:15 PM Rodman Pickle T wrote: Reason for CRM: patient was returning a call from the office there where no notes left on patient  chart patient would like a call back

## 2023-10-17 NOTE — Telephone Encounter (Signed)
 Spoke to pt and advised that I did not see a call either. Pt verbalized understanding.

## 2024-01-23 ENCOUNTER — Ambulatory Visit: Admitting: Family Medicine

## 2024-02-06 ENCOUNTER — Encounter: Payer: Self-pay | Admitting: Adult Health

## 2024-02-06 ENCOUNTER — Ambulatory Visit: Admitting: Adult Health

## 2024-02-06 VITALS — BP 130/80 | HR 73 | Temp 98.9°F | Ht 71.0 in | Wt 174.0 lb

## 2024-02-06 DIAGNOSIS — H6123 Impacted cerumen, bilateral: Secondary | ICD-10-CM | POA: Diagnosis not present

## 2024-02-06 NOTE — Progress Notes (Signed)
 Subjective:    Patient ID: Kenneth York, male    DOB: 12/01/1950, 73 y.o.   MRN: [id number]  HPI 73 year old male who  has a past medical history of Allergy, Cancer (HCC), and Scoliosis of thoracic spine.  He presents to the office today for an acute issue.  He reports that he went swimming yesterday and then developed a sensation of fluid in his ears.  This sensation has not gone away despite placing in a small amount of rubbing alcohol into his ear canal to help evaporate the water.  Does feel as though he has some mild loss of hearing.     Review of Systems See HPI   Past Medical History:  Diagnosis Date   Allergy    Cancer (HCC)    basal cell    Scoliosis of thoracic spine     Social History   Socioeconomic History   Marital status: Married    Spouse name: Not on file   Number of children: Not on file   Years of education: Not on file   Highest education level: Bachelor's degree (e.g., BA, AB, BS)  Occupational History   Not on file  Tobacco Use   Smoking status: Never   Smokeless tobacco: Never  Substance and Sexual Activity   Alcohol use: Yes    Alcohol/week: 4.0 standard drinks of alcohol    Types: 4 Cans of beer per week   Drug use: No   Sexual activity: Not on file  Other Topics Concern   Not on file  Social History Narrative   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    Married    No children    Social Drivers of Corporate investment banker Strain: Low Risk  (05/28/2023)   Overall Financial Resource Strain (CARDIA)    Difficulty of Paying Living Expenses: Not hard at all  Food Insecurity: No Food Insecurity (05/28/2023)   Hunger Vital Sign    Worried About Running Out of Food in the Last Year: Never true    Ran Out of Food in the Last Year: Never true  Transportation Needs: No Transportation Needs (05/28/2023)   PRAPARE - Administrator, Civil Service (Medical): No    Lack of Transportation  (Non-Medical): No  Physical Activity: Sufficiently Active (05/28/2023)   Exercise Vital Sign    Days of Exercise per Week: 6 days    Minutes of Exercise per Session: 60 min  Stress: No Stress Concern Present (05/28/2023)   [employer] of Occupational Health - Occupational Stress Questionnaire    Feeling of Stress : Not at all  Social Connections: Socially Integrated (05/28/2023)   Social Connection and Isolation Panel    Frequency of Communication with Friends and Family: More than three times a week    Frequency of Social Gatherings with Friends and Family: More than three times a week    Attends Religious Services: More than 4 times per year    Active Member of [employer] or Organizations: Yes    Attends Banker Meetings: More than 4 times per year    Marital Status: Married  Catering manager Violence: Not At Risk (09/08/2021)   Humiliation, Afraid, Rape, and Kick questionnaire    Fear of Current or Ex-Partner: No    Emotionally Abused: No    Physically Abused: No    Sexually Abused: No    Past Surgical History:  Procedure  Laterality Date   MOHS SURGERY  2004    Family History  Problem Relation Age of Onset   Ovarian cancer Mother    Heart disease Father    Prostate cancer Father    Colon cancer Neg Hx    Esophageal cancer Neg Hx    Rectal cancer Neg Hx    Stomach cancer Neg Hx     No Known Allergies  Current Outpatient Medications on File Prior to Visit  Medication Sig Dispense Refill   Ascorbic Acid (VITAMIN C) 100 MG tablet Take 100 mg by mouth daily.     cetirizine (ZYRTEC) 10 MG tablet      fluticasone  (FLONASE ) 50 MCG/ACT nasal spray Place 2 sprays into both nostrils daily. 16 g 2   Zinc 100 MG TABS Take by mouth.     No current facility-administered medications on file prior to visit.    BP 130/80   Pulse 73   Temp 98.9 F (37.2 C) (Oral)   Ht 5' 11 (1.803 m)   Wt 174 lb (78.9 kg)   SpO2 97%   BMI 24.27 kg/m       Objective:    Physical Exam Vitals and nursing note reviewed.  HENT:     Right Ear: There is impacted cerumen.     Left Ear: There is impacted cerumen.  Pulmonary:     Effort: Pulmonary effort is normal.     Breath sounds: Normal breath sounds.   Musculoskeletal:        General: Normal range of motion.   Skin:    General: Skin is warm and dry.     Capillary Refill: Capillary refill takes less than 2 seconds.   Neurological:     General: No focal deficit present.     Mental Status: He is oriented to person, place, and time.   Psychiatric:        Mood and Affect: Mood normal.        Behavior: Behavior normal.        Thought Content: Thought content normal.        Judgment: Judgment normal.        Assessment & Plan:  1. Bilateral impacted cerumen (Primary) Ear Cerumen Removal  Date/Time: 02/06/2024 3:25 PM  Performed by: Merna Huxley, NP Authorized by: Merna Huxley, NP  Location details: right ear and left ear Patient tolerance: patient tolerated the procedure well with no immediate complications Comments: Was able to remove all cerumen from his right ear without difficulty. When removing cerumen from left ear he had some skin break down in the canal with mild bleeding and pain. He was able to tolerate the procedure. He had a small amount of cerumen against the TM that could not be removed. Advised to let water from shower head irrigate the canal. He did feel as though his symptoms resolved once cerumen was removed  Procedure type: irrigation  Sedation: Patient sedated: no       Huxley Merna, NP

## 2024-02-06 NOTE — Telephone Encounter (Signed)
 Please advise if pt needs an appt. For this

## 2024-04-16 ENCOUNTER — Encounter: Payer: Self-pay | Admitting: Family Medicine

## 2024-04-16 ENCOUNTER — Ambulatory Visit: Admitting: Family Medicine

## 2024-04-16 DIAGNOSIS — Z0000 Encounter for general adult medical examination without abnormal findings: Secondary | ICD-10-CM | POA: Diagnosis not present

## 2024-04-16 NOTE — Progress Notes (Signed)
 Patient unable to obtain vital signs due to telehealth visit

## 2024-04-16 NOTE — Progress Notes (Signed)
 PATIENT CHECK-IN and HEALTH RISK ASSESSMENT QUESTIONNAIRE:  -completed by phone/video for upcoming Medicare Preventive Visit   Pre-Visit Check-in: 1)Vitals (height, wt, BP, etc) - record in vitals section for visit on day of visit Request home vitals (wt, BP, etc.) and enter into vitals, THEN update Vital Signs SmartPhrase below at the top of the HPI. See below.  2)Review and Update Medications, Allergies PMH, Surgeries, Social history in Epic 3)Hospitalizations in the last year with date/reason? No   4)Review and Update Care Team (patient's specialists) in Epic 5) Complete PHQ9 in Epic  6) Complete Fall Screening in Epic 7)Review all Health Maintenance Due and order if not done.  Medicare Wellness Patient Questionnaire:  Answer theses question about your habits: How often do you have a drink containing alcohol?y How many drinks containing alcohol do you have on a typical dnay when you are drinking?1-2  How often do you have six or more drinks on one occasion? Have you ever smoked? No Quit date if applicable? na  How many packs a day do/did you smoke? na Do you use smokeless tobacco? no Do you use an illicit drugs? No  On average, how many days per week do you engage in moderate to strenuous exercise (like a brisk walk)?6 days On average, how many minutes do you engage in exercise at this level?1-1.5 hours, walking, gym, balance Typical breakfast: Cereal, Granola, Toast  Typical lunch: Varies Typical dinner: Varies, veggies, proteins Typical snacks: Nuts   Beverages: Gatorade, Water Sleep is good   Answer theses question about your everyday activities: Can you perform most household chores?Yes  Are you deaf or have significant trouble hearing?no Do you feel that you have a problem with memory?no Do you feel safe at home? Yes  Last dentist visit? 09/2023 8. Do you have any difficulty performing your everyday activities?no Are you having any difficulty walking, taking  medications on your own, and or difficulty managing daily home needs?no Do you have difficulty walking or climbing stairs?no Do you have difficulty dressing or bathing?no Do you have difficulty doing errands alone such as visiting a doctor's office or shopping?no Do you currently have any difficulty preparing food and eating?no Do you currently have any difficulty using the toilet?no Do you have any difficulty managing your finances?no Do you have any difficulties with housekeeping of managing your housekeeping?no   Do you have Advanced Directives in place (Living Will, Healthcare Power or Attorney)? Yes    Last eye Exam and location? 1 year ago   Do you currently use prescribed or non-prescribed narcotic or opioid pain medications? no  Do you have a history or close family history of breast, ovarian, tubal or peritoneal cancer or a family member with BRCA (breast cancer susceptibility 1 and 2) gene mutations? Yes- Ovarian cancer in mother     ----------------------------------------------------------------------------------------------------------------------------------------------------------------------------------------------------------------------  Because this visit was a virtual/telehealth visit, some criteria may be missing or patient reported. Any vitals not documented were not able to be obtained and vitals that have been documented are patient reported.    MEDICARE ANNUAL PREVENTIVE CARE VISIT WITH PROVIDER (Welcome to Medicare, initial annual wellness or annual wellness exam)  Virtual Visit via Video Note  I connected with Elsie KANDICE Louder on 04/16/24  by a video enabled telemedicine application and verified that I am speaking with the correct person using two identifiers.  Location patient: home Location provider:work or home office Persons participating in the virtual visit: patient, provider  Concerns and/or follow up today: feeling good.  No concerns.    See  HM section in Epic for other details of completed HM.    ROS: negative for report of fevers, unintentional weight loss, vision changes, vision loss, hearing loss or change, chest pain, sob, hemoptysis, melena, hematochezia, hematuria, falls, bleeding or bruising, thoughts of suicide or self harm, memory loss  Patient-completed extensive health risk assessment - reviewed and discussed with the patient: See Health Risk Assessment completed with patient prior to the visit either above or in recent phone note. This was reviewed in detailed with the patient today and appropriate recommendations, orders and referrals were placed as needed per Summary below and patient instructions.   Review of Medical History: -PMH, PSH, Family History and current specialty and care providers reviewed and updated and listed below   Patient Care Team: Merna Huxley, NP as PCP - General (Family Medicine) Livingston Rigg, MD as Consulting Physician (Dermatology)   Past Medical History:  Diagnosis Date   Allergy    Cancer [facility])    basal cell    Scoliosis of thoracic spine     Past Surgical History:  Procedure Laterality Date   MOHS SURGERY  2004    Social History   Socioeconomic History   Marital status: Married    Spouse name: Not on file   Number of children: Not on file   Years of education: Not on file   Highest education level: Bachelor's degree (e.g., BA, AB, BS)  Occupational History   Not on file  Tobacco Use   Smoking status: Never   Smokeless tobacco: Never  Substance and Sexual Activity   Alcohol use: Yes    Alcohol/week: 4.0 standard drinks of alcohol    Types: 4 Cans of beer per week   Drug use: No   Sexual activity: Not on file  Other Topics Concern   Not on file  Social History Narrative   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    Married    No children    Social Drivers of Corporate investment banker Strain: Low Risk  (04/16/2024)    Overall Financial Resource Strain (CARDIA)    Difficulty of Paying Living Expenses: Not hard at all  Food Insecurity: No Food Insecurity (04/16/2024)   Hunger Vital Sign    Worried About Running Out of Food in the Last Year: Never true    Ran Out of Food in the Last Year: Never true  Transportation Needs: No Transportation Needs (04/16/2024)   PRAPARE - Administrator, Civil Service (Medical): No    Lack of Transportation (Non-Medical): No  Physical Activity: Sufficiently Active (04/16/2024)   Exercise Vital Sign    Days of Exercise per Week: 6 days    Minutes of Exercise per Session: 60 min  Stress: No Stress Concern Present (04/16/2024)   [employer] of Occupational Health - Occupational Stress Questionnaire    Feeling of Stress: Not at all  Social Connections: Socially Integrated (04/16/2024)   Social Connection and Isolation Panel    Frequency of Communication with Friends and Family: More than three times a week    Frequency of Social Gatherings with Friends and Family: Twice a week    Attends Religious Services: More than 4 times per year    Active Member of [employer] or Organizations: Yes    Attends Engineer, structural: More than 4 times per year    Marital Status: Married  Catering manager  Violence: Not At Risk (04/16/2024)   Humiliation, Afraid, Rape, and Kick questionnaire    Fear of Current or Ex-Partner: No    Emotionally Abused: No    Physically Abused: No    Sexually Abused: No    Family History  Problem Relation Age of Onset   Ovarian cancer Mother    Heart disease Father    Prostate cancer Father    Colon cancer Neg Hx    Esophageal cancer Neg Hx    Rectal cancer Neg Hx    Stomach cancer Neg Hx     Current Outpatient Medications on File Prior to Visit  Medication Sig Dispense Refill   Ascorbic Acid (VITAMIN C) 100 MG tablet Take 100 mg by mouth daily.     cetirizine (ZYRTEC) 10 MG tablet      No current facility-administered medications  on file prior to visit.    No Known Allergies     Physical Exam Vitals requested from patient and listed below if patient had equipment and was able to obtain at home for this virtual visit: There were no vitals filed for this visit. Estimated body mass index is 24.27 kg/m as calculated from the following:   Height as of 02/06/24: 5' 11 (1.803 m).   Weight as of 02/06/24: 174 lb (78.9 kg).  EKG (optional): deferred due to virtual visit  GENERAL: alert, oriented, no acute distress detected; full vision exam deferred due to pandemic and/or virtual encounter  HEENT: atraumatic, conjunttiva clear, no obvious abnormalities on inspection of external nose and ears  NECK: normal movements of the head and neck  LUNGS: on inspection no signs of respiratory distress, breathing rate appears normal, no obvious gross SOB, gasping or wheezing  CV: no obvious cyanosis  MS: moves all visible extremities without noticeable abnormality  PSYCH/NEURO: pleasant and cooperative, no obvious depression or anxiety, speech and thought processing grossly intact, Cognitive function grossly intact  Flowsheet Row Video Visit from 10/04/2022 in [facility]  PHQ-9 Total Score 0        04/16/2024    3:35 PM 02/06/2024    2:21 PM 10/04/2022    5:14 PM 01/11/2022    7:28 AM 09/08/2021   11:15 AM  Depression screen PHQ 2/9  Decreased Interest 0 0 0 0 0  Down, Depressed, Hopeless 0 0 0 0 0  PHQ - 2 Score 0 0 0 0 0  Altered sleeping   0 0   Tired, decreased energy   0 0   Change in appetite   0 0   Feeling bad or failure about yourself    0 0   Trouble concentrating   0 0   Moving slowly or fidgety/restless   0 0   Suicidal thoughts   0 0   PHQ-9 Score   0 0   Difficult doing work/chores   Not difficult at all Not difficult at all        10/04/2022    5:14 PM 05/28/2023   11:55 AM 04/15/2024    4:34 PM 04/16/2024    2:41 PM 04/16/2024    2:53 PM  Fall Risk  Falls in the  past year? 0 0 0 0 0  Was there an injury with Fall? 0   0 0  Fall Risk Category Calculator 0   0 0  Patient at Risk for Falls Due to    No Fall Risks No Fall Risks  Fall risk Follow up  Falls evaluation completed Falls evaluation completed     SUMMARY AND PLAN:  Encounter for Medicare annual wellness exam   Discussed applicable health maintenance/preventive health measures and advised and referred or ordered per patient preferences: -discussed vaccines due recs/risks, advised if gets any at the pharmacy to please let us  know so that we can update his records.  Health Maintenance  Topic Date Due   DTaP/Tdap/Td (3 - Tdap) 07/15/2019   INFLUENZA VACCINE  03/13/2024   COVID-19 Vaccine (4 - 2025-26 season) 04/13/2024   Colonoscopy  02/17/2025   Medicare Annual Wellness (AWV)  04/16/2025   Pneumococcal Vaccine: 50+ Years  Completed   Hepatitis C Screening  Completed   Zoster Vaccines- Shingrix  Completed   HPV VACCINES  Aged Out   Meningococcal B Vaccine  Aged Out     Education and counseling on the following was provided based on the above review of health and a plan/checklist for the patient, along with additional information discussed, was provided for the patient in the patient instructions :    -Advised and counseled on a healthy lifestyle - including the importance of a healthy diet, regular physical activity,  -Reviewed patient's current diet. Congratulated on healthy choices.  A summary of a healthy diet was provided in the Patient Instructions.  -reviewed patient's current physical activity level and discussed exercise guidelines for adults. Congratulated on healthy habits. Further resources provided in patient instructions.   -Advise yearly dental visits at minimum and regular eye exams -Advised and counseled on alcohol safe limits  Follow up: see patient instructions   Patient Instructions  I really enjoyed getting to talk with you today! I am available on Tuesdays  and Thursdays for virtual visits if you have any questions or concerns, or if I can be of any further assistance.   CHECKLIST FROM ANNUAL WELLNESS VISIT:  -Follow up (please call to schedule if not scheduled after visit):   -yearly for annual wellness visit with primary care office  Here is a list of your preventive care/health maintenance measures and the plan for each if any are due:  PLAN For any measures below that may be due:    1. Can get the flu shot at the office.   2. Can get the other vaccines at the pharmacy; please let us  know if you do so that we can update your records.   Health Maintenance  Topic Date Due   DTaP/Tdap/Td (3 - Tdap) 07/15/2019   INFLUENZA VACCINE  03/13/2024   COVID-19 Vaccine (4 - 2025-26 season) 04/13/2024   Colonoscopy  02/17/2025   Medicare Annual Wellness (AWV)  04/16/2025   Pneumococcal Vaccine: 50+ Years  Completed   Hepatitis C Screening  Completed   Zoster Vaccines- Shingrix  Completed   HPV VACCINES  Aged Out   Meningococcal B Vaccine  Aged Out    -See a dentist at least yearly  -Get your eyes checked and then per your eye specialist's recommendations  -Other issues addressed today:   -I have included below further information regarding a healthy whole foods based diet, physical activity guidelines for adults, stress management and opportunities for social connections. I hope you find this information useful.   -----------------------------------------------------------------------------------------------------------------------------------------------------------------------------------------------------------------------------------------------------------    NUTRITION: -eat real food: lots of colorful vegetables (half the plate) and fruits -5-7 servings of vegetables and fruits per day (fresh or steamed is best), exp. 2 servings of vegetables with lunch and dinner and 2 servings of fruit per day. Berries and greens such  as kale  and collards are great choices.  -consume on a regular basis:  fresh fruits, fresh veggies, fish, nuts, seeds, healthy oils (such as olive oil, avocado oil), whole grains (make sure for bread/pasta/crackers/etc., that the first ingredient on label contains the word whole), legumes. -can eat small amounts of dairy and lean meat (no larger than the palm of your hand), but avoid processed meats such as ham, bacon, lunch meat, etc. -drink water -try to avoid fast food and pre-packaged foods, processed meat, ultra processed foods/beverages (donuts, candy, etc.) -most experts advise limiting sodium to < 2300mg  per day, should limit further is any chronic conditions such as high blood pressure, heart disease, diabetes, etc. The American Heart Association advised that < 1500mg  is is ideal -try to avoid foods/beverages that contain any ingredients with names you do not recognize  -try to avoid foods/beverages  with added sugar or sweeteners/sweets  -try to avoid sweet drinks (including diet drinks): soda, juice, Gatorade, sweet tea, power drinks, diet drinks -try to avoid white rice, white bread, pasta (unless whole grain)  EXERCISE GUIDELINES FOR ADULTS: -if you wish to increase your physical activity, do so gradually and with the approval of your doctor -STOP and seek medical care immediately if you have any chest pain, chest discomfort or trouble breathing when starting or increasing exercise  -move and stretch your body, legs, feet and arms when sitting for long periods -Physical activity guidelines for optimal health in adults: -get at least 150 minutes per week of moderate exercise (can talk, but not sing); this is about 20-30 minutes of sustained activity 5-7 days per week or two 10-15 minute episodes of sustained activity 5-7 days per week -do some muscle building/resistance training/strength training at least 2 days per week  -balance exercises 3+ days per week:   Stand somewhere where you have  something sturdy to hold onto if you lose balance    1) lift up on toes, then back down, start with 5x per day and work up to 20x   2) stand and lift one leg straight out to the side so that foot is a few inches of the floor, start with 5x each side and work up to 20x each side   3) stand on one foot, start with 5 seconds each side and work up to 20 seconds on each side  If you need ideas or help with getting more active:  -Silver sneakers https://tools.silversneakers.com  -Walk with a Doc: http://www.duncan-williams.com/  -try to include resistance (weight lifting/strength building) and balance exercises twice per week: or the following link for ideas: http://castillo-powell.com/  BuyDucts.dk  STRESS MANAGEMENT: -can try meditating, or just sitting quietly with deep breathing while intentionally relaxing all parts of your body for 5 minutes daily -if you need further help with stress, anxiety or depression please follow up with your primary doctor or contact the wonderful folks at [facility]: [phone]  SOCIAL CONNECTIONS: -options in [place] if you wish to engage in more social and exercise related activities:  -Silver sneakers https://tools.silversneakers.com  -Walk with a Doc: http://www.duncan-williams.com/  -Check out the [facility] Active Adults 50+ section on the [place] of [employer] (hiking clubs, book clubs, cards and games, chess, exercise classes, aquatic classes and much more) - see the website for details: https://www.[place].gov/departments/parks-recreation/active-adults50  -YouTube has lots of exercise videos for different ages and abilities as well  -[facility] (a variety of indoor and outdoor inperson activities for adults). [phone]. [address]  -  Virtual Online Classes (a variety of topics): see seniorplanet.org or call  [phone]  -consider volunteering at a school, hospice center, church, senior center or elsewhere            Chiquita JONELLE Cramp, DO

## 2024-04-16 NOTE — Patient Instructions (Addendum)
 I really enjoyed getting to talk with you today! I am available on Tuesdays and Thursdays for virtual visits if you have any questions or concerns, or if I can be of any further assistance.   CHECKLIST FROM ANNUAL WELLNESS VISIT:  -Follow up (please call to schedule if not scheduled after visit):   -yearly for annual wellness visit with primary care office  Here is a list of your preventive care/health maintenance measures and the plan for each if any are due:  PLAN For any measures below that may be due:    1. Can get the flu shot at the office.   2. Can get the other vaccines at the pharmacy; please let us  know if you do so that we can update your records.   Health Maintenance  Topic Date Due   DTaP/Tdap/Td (3 - Tdap) 07/15/2019   INFLUENZA VACCINE  03/13/2024   COVID-19 Vaccine (4 - 2025-26 season) 04/13/2024   Colonoscopy  02/17/2025   Medicare Annual Wellness (AWV)  04/16/2025   Pneumococcal Vaccine: 50+ Years  Completed   Hepatitis C Screening  Completed   Zoster Vaccines- Shingrix  Completed   HPV VACCINES  Aged Out   Meningococcal B Vaccine  Aged Out    -See a dentist at least yearly  -Get your eyes checked and then per your eye specialist's recommendations  -Other issues addressed today:   -I have included below further information regarding a healthy whole foods based diet, physical activity guidelines for adults, stress management and opportunities for social connections. I hope you find this information useful.   -----------------------------------------------------------------------------------------------------------------------------------------------------------------------------------------------------------------------------------------------------------    NUTRITION: -eat real food: lots of colorful vegetables (half the plate) and fruits -5-7 servings of vegetables and fruits per day (fresh or steamed is best), exp. 2 servings of vegetables with lunch  and dinner and 2 servings of fruit per day. Berries and greens such as kale and collards are great choices.  -consume on a regular basis:  fresh fruits, fresh veggies, fish, nuts, seeds, healthy oils (such as olive oil, avocado oil), whole grains (make sure for bread/pasta/crackers/etc., that the first ingredient on label contains the word whole), legumes. -can eat small amounts of dairy and lean meat (no larger than the palm of your hand), but avoid processed meats such as ham, bacon, lunch meat, etc. -drink water -try to avoid fast food and pre-packaged foods, processed meat, ultra processed foods/beverages (donuts, candy, etc.) -most experts advise limiting sodium to < 2300mg  per day, should limit further is any chronic conditions such as high blood pressure, heart disease, diabetes, etc. The American Heart Association advised that < 1500mg  is is ideal -try to avoid foods/beverages that contain any ingredients with names you do not recognize  -try to avoid foods/beverages  with added sugar or sweeteners/sweets  -try to avoid sweet drinks (including diet drinks): soda, juice, Gatorade, sweet tea, power drinks, diet drinks -try to avoid white rice, white bread, pasta (unless whole grain)  EXERCISE GUIDELINES FOR ADULTS: -if you wish to increase your physical activity, do so gradually and with the approval of your doctor -STOP and seek medical care immediately if you have any chest pain, chest discomfort or trouble breathing when starting or increasing exercise  -move and stretch your body, legs, feet and arms when sitting for long periods -Physical activity guidelines for optimal health in adults: -get at least 150 minutes per week of moderate exercise (can talk, but not sing); this is about 20-30 minutes of sustained activity 5-7  days per week or two 10-15 minute episodes of sustained activity 5-7 days per week -do some muscle building/resistance training/strength training at least 2 days per  week  -balance exercises 3+ days per week:   Stand somewhere where you have something sturdy to hold onto if you lose balance    1) lift up on toes, then back down, start with 5x per day and work up to 20x   2) stand and lift one leg straight out to the side so that foot is a few inches of the floor, start with 5x each side and work up to 20x each side   3) stand on one foot, start with 5 seconds each side and work up to 20 seconds on each side  If you need ideas or help with getting more active:  -Silver sneakers https://tools.silversneakers.com  -Walk with a Doc: http://www.duncan-williams.com/  -try to include resistance (weight lifting/strength building) and balance exercises twice per week: or the following link for ideas: http://castillo-powell.com/  BuyDucts.dk  STRESS MANAGEMENT: -can try meditating, or just sitting quietly with deep breathing while intentionally relaxing all parts of your body for 5 minutes daily -if you need further help with stress, anxiety or depression please follow up with your primary doctor or contact the wonderful folks at [facility]: [phone]  SOCIAL CONNECTIONS: -options in [place] if you wish to engage in more social and exercise related activities:  -Silver sneakers https://tools.silversneakers.com  -Walk with a Doc: http://www.duncan-williams.com/  -Check out the [facility] Active Adults 50+ section on the [place] of [employer] (hiking clubs, book clubs, cards and games, chess, exercise classes, aquatic classes and much more) - see the website for details: https://www.[place].gov/departments/parks-recreation/active-adults50  -YouTube has lots of exercise videos for different ages and abilities as well  -[facility] (a variety of indoor and outdoor inperson activities for adults). [phone]. [address].  -Virtual Online Classes (a variety of topics): see seniorplanet.org or call [phone]  -consider volunteering at a school, hospice center, church, senior center or elsewhere

## 2024-05-14 ENCOUNTER — Telehealth: Payer: Self-pay | Admitting: Adult Health

## 2024-05-15 NOTE — Telephone Encounter (Signed)
 Form dropped off to put in file

## 2024-05-15 NOTE — Telephone Encounter (Signed)
 Noted

## 2024-05-25 ENCOUNTER — Encounter: Payer: Self-pay | Admitting: Adult Health

## 2024-05-26 ENCOUNTER — Ambulatory Visit: Payer: Self-pay

## 2024-05-26 NOTE — Telephone Encounter (Signed)
 FYI Only or Action Required?: FYI only for provider.  Patient was last seen in primary care on 04/16/2024 by Luke Chiquita SAUNDERS, DO.  Called Nurse Triage reporting Back Pain.  Symptoms began 2 weeks ago.  Interventions attempted: OTC medications: Advil and Ice/heat application.  Symptoms are: stable.  Triage Disposition: See PCP When Office is Open (Within 3 Days)  Patient/caregiver understands and will follow disposition?: Yes Reason for Disposition  [1] MODERATE back pain (e.g., interferes with normal activities) AND [2] present > 3 days  Answer Assessment - Initial Assessment Questions 1. ONSET: When did the pain begin? (e.g., minutes, hours, days)     Beginning of the month, getting worse  2. LOCATION: Where does it hurt? (upper, mid or lower back)      Lower back, from left to middle  3. SEVERITY: How bad is the pain?  (e.g., Scale 1-10; mild, moderate, or severe)     4/10  4. PATTERN: Is the pain constant? (e.g., yes, no; constant, intermittent)      Constant  5. RADIATION: Does the pain shoot into your legs or somewhere else?     Denies, has hx of back pain that radiated into leg before  6. CAUSE:  What do you think is causing the back pain?      Unsure, but first noticed it after playing golf.  7. BACK OVERUSE:  Any recent lifting of heavy objects, strenuous work or exercise?     Denies  8. MEDICINES: What have you taken so far for the pain? (e.g., nothing, acetaminophen, NSAIDS)     Advil, with moderate relief.  9. NEUROLOGIC SYMPTOMS: Do you have any weakness, numbness, or problems with bowel/bladder control?     Denies  10. OTHER SYMPTOMS: Do you have any other symptoms? (e.g., fever, abdomen pain, burning with urination, blood in urine)       Denies  Protocols used: Back Pain-A-AH  Copied from CRM [id number]. Topic: Clinical - Red Word Triage >> May 26, 2024  9:58 AM [place] J wrote: [facility] that prompted transfer to Nurse Triage: Patient has  lower back pain from playing golf.

## 2024-05-27 ENCOUNTER — Encounter: Payer: Self-pay | Admitting: Adult Health

## 2024-05-27 ENCOUNTER — Ambulatory Visit (INDEPENDENT_AMBULATORY_CARE_PROVIDER_SITE_OTHER): Admitting: Adult Health

## 2024-05-27 ENCOUNTER — Ambulatory Visit (INDEPENDENT_AMBULATORY_CARE_PROVIDER_SITE_OTHER)
Admission: RE | Admit: 2024-05-27 | Discharge: 2024-05-27 | Disposition: A | Discharge status: Home / Self Care | Source: Ambulatory Visit | Attending: Adult Health | Admitting: Adult Health

## 2024-05-27 VITALS — BP 120/88 | HR 67 | Temp 98.5°F | Ht 71.0 in | Wt 173.0 lb

## 2024-05-27 DIAGNOSIS — M545 Low back pain, unspecified: Secondary | ICD-10-CM

## 2024-05-27 DIAGNOSIS — Z23 Encounter for immunization: Secondary | ICD-10-CM

## 2024-05-27 DIAGNOSIS — M47817 Spondylosis without myelopathy or radiculopathy, lumbosacral region: Secondary | ICD-10-CM | POA: Diagnosis not present

## 2024-05-27 NOTE — Patient Instructions (Addendum)
 I am going to order a low back xray for you. I think this is more muscular in origin.    Go to [facility]   [address] [place]

## 2024-05-27 NOTE — Progress Notes (Signed)
 Subjective:    Patient ID: Kenneth York, male    DOB: [birth year], 73 y.o.   MRN: [id number]  Back Pain   Discussed the use of AI scribe software for clinical note transcription with the patient, who gave verbal consent to proceed.  History of Present Illness   Kenneth York is a 73 year old male who presents with lower back pain.  He has experienced lower back pain since playing golf on October 1st or 2nd. The pain is localized to the left lower back and does not radiate down the leg. It has improved from a 'four to a two' on a pain scale but remains persistent, affecting his ability to play golf and perform activities like putting on socks and shoes.  He has a history of microdiscectomy and is concerned about returning to that state. He has been diligent with exercise and stretching routines, including walking, but has reduced his walking by half due to the current pain. He manages the pain with Advil, taking two tablets as needed, which provides relief.   He feels better standing than sitting. Climbing stairs is not significantly affected, and he has not engaged in strenuous activities like weightlifting. He has paused yoga and other exercises until further evaluation.        Review of Systems See HPI   Past Medical History:  Diagnosis Date   Allergy    Cancer (HCC)    basal cell    Scoliosis of thoracic spine     Social History   Socioeconomic History   Marital status: Married    Spouse name: Not on file   Number of children: Not on file   Years of education: Not on file   Highest education level: Bachelor's degree (e.g., BA, AB, BS)  Occupational History   Not on file  Tobacco Use   Smoking status: Never   Smokeless tobacco: Never  Substance and Sexual Activity   Alcohol use: Yes    Alcohol/week: 4.0 standard drinks of alcohol    Types: 4 Cans of beer per week   Drug use: No   Sexual activity: Not on file  Other Topics Concern   Not on file   Social History Narrative   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    Married    No children    Social Drivers of Corporate investment banker Strain: Low Risk  (05/26/2024)   Overall Financial Resource Strain (CARDIA)    Difficulty of Paying Living Expenses: Not hard at all  Food Insecurity: No Food Insecurity (05/26/2024)   Hunger Vital Sign    Worried About Running Out of Food in the Last Year: Never true    Ran Out of Food in the Last Year: Never true  Transportation Needs: No Transportation Needs (05/26/2024)   PRAPARE - Administrator, Civil Service (Medical): No    Lack of Transportation (Non-Medical): No  Physical Activity: Sufficiently Active (05/26/2024)   Exercise Vital Sign    Days of Exercise per Week: 7 days    Minutes of Exercise per Session: 60 min  Stress: No Stress Concern Present (05/26/2024)   [employer] of Occupational Health - Occupational Stress Questionnaire    Feeling of Stress: Not at all  Social Connections: Socially Integrated (05/26/2024)   Social Connection and Isolation Panel    Frequency of Communication with Friends and Family: More than three times a week  Frequency of Social Gatherings with Friends and Family: Twice a week    Attends Religious Services: More than 4 times per year    Active Member of [employer] or Organizations: Yes    Attends Engineer, structural: More than 4 times per year    Marital Status: Married  Catering manager Violence: Not At Risk (04/16/2024)   Humiliation, Afraid, Rape, and Kick questionnaire    Fear of Current or Ex-Partner: No    Emotionally Abused: No    Physically Abused: No    Sexually Abused: No    Past Surgical History:  Procedure Laterality Date   MOHS SURGERY  2004    Family History  Problem Relation Age of Onset   Ovarian cancer Mother    Heart disease Father    Prostate cancer Father    Colon cancer Neg Hx    Esophageal cancer Neg  Hx    Rectal cancer Neg Hx    Stomach cancer Neg Hx     No Known Allergies  Current Outpatient Medications on File Prior to Visit  Medication Sig Dispense Refill   Ascorbic Acid (VITAMIN C) 100 MG tablet Take 100 mg by mouth daily.     cetirizine (ZYRTEC) 10 MG tablet      No current facility-administered medications on file prior to visit.    BP 120/88   Pulse 67   Temp 98.5 F (36.9 C) (Oral)   Ht 5' 11 (1.803 m)   Wt 173 lb (78.5 kg)   SpO2 97%   BMI 24.13 kg/m       Objective:   Physical Exam Vitals and nursing note reviewed.  Constitutional:      Appearance: Normal appearance.  Musculoskeletal:        General: Normal range of motion.       Back:  Skin:    General: Skin is warm and dry.  Neurological:     General: No focal deficit present.     Mental Status: He is alert and oriented to person, place, and time.  Psychiatric:        Mood and Affect: Mood normal.        Behavior: Behavior normal.        Thought Content: Thought content normal.        Judgment: Judgment normal.        Assessment & Plan:  1. Acute left-sided low back pain without sciatica (Primary) - Likely muscle strain since it is improving but due to history will get xray of lumbar spine. He did not York a prescription for a muscle relaxer at this time.  - Advised gentle stretching and can continue to walk for exercise  - DG Lumbar Spine Complete; Future  2. Need for influenza vaccination  - Flu vaccine HIGH DOSE PF(Fluzone Trivalent)  Haruka Kowaleski, NP   I personally spent a total of 31 minutes in the care of the patient today including preparing to see the patient, getting/reviewing separately obtained history, counseling and educating, placing orders, and documenting clinical information in the EHR.

## 2024-06-02 ENCOUNTER — Ambulatory Visit: Payer: Self-pay | Admitting: Adult Health

## 2024-06-02 ENCOUNTER — Encounter: Payer: Self-pay | Admitting: Adult Health

## 2024-06-19 DIAGNOSIS — H2513 Age-related nuclear cataract, bilateral: Secondary | ICD-10-CM | POA: Diagnosis not present

## 2024-06-19 DIAGNOSIS — H40013 Open angle with borderline findings, low risk, bilateral: Secondary | ICD-10-CM | POA: Diagnosis not present

## 2024-06-19 DIAGNOSIS — H11152 Pinguecula, left eye: Secondary | ICD-10-CM | POA: Diagnosis not present

## 2024-06-19 DIAGNOSIS — D492 Neoplasm of unspecified behavior of bone, soft tissue, and skin: Secondary | ICD-10-CM | POA: Diagnosis not present

## 2024-06-29 ENCOUNTER — Encounter: Payer: Self-pay | Admitting: Adult Health

## 2024-06-30 ENCOUNTER — Ambulatory Visit: Admitting: Adult Health

## 2024-06-30 ENCOUNTER — Ambulatory Visit: Payer: Self-pay

## 2024-06-30 ENCOUNTER — Encounter: Payer: Self-pay | Admitting: Adult Health

## 2024-06-30 VITALS — BP 130/78 | HR 67 | Temp 97.8°F | Ht 71.0 in | Wt 174.6 lb

## 2024-06-30 DIAGNOSIS — N400 Enlarged prostate without lower urinary tract symptoms: Secondary | ICD-10-CM

## 2024-06-30 DIAGNOSIS — R350 Frequency of micturition: Secondary | ICD-10-CM

## 2024-06-30 LAB — POCT URINALYSIS DIPSTICK
Bilirubin, UA: NEGATIVE
Blood, UA: NEGATIVE
Glucose, UA: NEGATIVE
Ketones, UA: NEGATIVE
Leukocytes, UA: NEGATIVE
Nitrite, UA: NEGATIVE
Protein, UA: NEGATIVE
Spec Grav, UA: <=1.005 — AB (ref 1.010–1.025)
Urobilinogen, UA: 0.2 U/dL
pH, UA: 6.5 (ref 5.0–8.0)

## 2024-06-30 NOTE — Progress Notes (Signed)
 Subjective:    Patient ID: ERIQUE York, male    DOB: Sep 22, 1950, 73 y.o.   MRN: [id number]  HPI  Discussed the use of AI scribe software for clinical note transcription with the patient, who gave verbal consent to proceed.  History of Present Illness   Kenneth York is a 73 year old male who presents with acute urinary frequency   He experiences increased urinary frequency over the last few days.  There is discomfort in the lower abdomen and a strong urge to urinate without dysuria. Urinary frequency persists with a decreased stream that intermittently stops and starts. No hematuria is present.Nocturia occurs, averaging two times per night.  A urologist previously recommended Flomax, but he did not take it. His PSA level was 2.0 in 2023.  He consumes about a cup and a half of coffee in the morning and occasionally eats spicy foods. He maintains hydration without excessive water intake.        Review of Systems See HPI   Past Medical History:  Diagnosis Date   Allergy    Cancer (HCC)    basal cell    Scoliosis of thoracic spine     Social History   Socioeconomic History   Marital status: Married    Spouse name: Not on file   Number of children: Not on file   Years of education: Not on file   Highest education level: Bachelor's degree (e.g., BA, AB, BS)  Occupational History   Not on file  Tobacco Use   Smoking status: Never   Smokeless tobacco: Never  Substance and Sexual Activity   Alcohol use: Yes    Alcohol/week: 4.0 standard drinks of alcohol    Types: 4 Cans of beer per week   Drug use: No   Sexual activity: Not on file  Other Topics Concern   Not on file  Social History Narrative   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    Married    No children    Social Drivers of Corporate Investment Banker Strain: Low Risk  (05/26/2024)   Overall Financial Resource Strain (CARDIA)    Difficulty of  Paying Living Expenses: Not hard at all  Food Insecurity: No Food Insecurity (05/26/2024)   Hunger Vital Sign    Worried About Running Out of Food in the Last Year: Never true    Ran Out of Food in the Last Year: Never true  Transportation Needs: No Transportation Needs (05/26/2024)   PRAPARE - Administrator, Civil Service (Medical): No    Lack of Transportation (Non-Medical): No  Physical Activity: Sufficiently Active (05/26/2024)   Exercise Vital Sign    Days of Exercise per Week: 7 days    Minutes of Exercise per Session: 60 min  Stress: No Stress Concern Present (05/26/2024)   [employer] of Occupational Health - Occupational Stress Questionnaire    Feeling of Stress: Not at all  Social Connections: Socially Integrated (05/26/2024)   Social Connection and Isolation Panel    Frequency of Communication with Friends and Family: More than three times a week    Frequency of Social Gatherings with Friends and Family: Twice a week    Attends Religious Services: More than 4 times per year    Active Member of [employer] or Organizations: Yes    Attends Banker Meetings: More than 4 times per year    Marital Status:  Married  Intimate Partner Violence: Not At Risk (04/16/2024)   Humiliation, Afraid, Rape, and Kick questionnaire    Fear of Current or Ex-Partner: No    Emotionally Abused: No    Physically Abused: No    Sexually Abused: No    Past Surgical History:  Procedure Laterality Date   MOHS SURGERY  2004    Family History  Problem Relation Age of Onset   Ovarian cancer Mother    Heart disease Father    Prostate cancer Father    Colon cancer Neg Hx    Esophageal cancer Neg Hx    Rectal cancer Neg Hx    Stomach cancer Neg Hx     No Known Allergies  Current Outpatient Medications on File Prior to Visit  Medication Sig Dispense Refill   Ascorbic Acid (VITAMIN C) 100 MG tablet Take 100 mg by mouth daily.     cetirizine (ZYRTEC) 10 MG tablet       No current facility-administered medications on file prior to visit.    BP 130/78   Pulse 67   Temp 97.8 F (36.6 C)   Ht 5' 11 (1.803 m)   Wt 174 lb 9.6 oz (79.2 kg)   SpO2 98%   BMI 24.35 kg/m       Objective:   Physical Exam Vitals and nursing note reviewed.  Constitutional:      Appearance: Normal appearance.  Cardiovascular:     Rate and Rhythm: Regular rhythm.     Pulses: Normal pulses.     Heart sounds: Normal heart sounds.  Pulmonary:     Effort: Pulmonary effort is normal.     Breath sounds: Normal breath sounds.  Abdominal:     General: Abdomen is flat. Bowel sounds are normal. There is no distension.     Palpations: Abdomen is soft. There is no mass.     Tenderness: There is no abdominal tenderness.  Skin:    General: Skin is warm and dry.     Capillary Refill: Capillary refill takes less than 2 seconds.  Neurological:     General: No focal deficit present.     Mental Status: He is alert and oriented to person, place, and time.  Psychiatric:        Mood and Affect: Mood normal.        Behavior: Behavior normal.        Thought Content: Thought content normal.        Judgment: Judgment normal.         Assessment & Plan:  Assessment and Plan    Urinary Frequency  - POC UA negative. Likely in the setting of BPH. I do not think this is OAB   Benign prostatic hyperplasia with lower urinary tract symptoms Chronic BPH with exacerbated LUTS. No UTI. Symptoms suggest incomplete bladder emptying due to prostate enlargement. Declined Flomax due to side effect concerns. - Scheduled PSA test with physical exam in December.       Olamide Lahaie, NP  I personally spent a total of 30 minutes in the care of the patient today including preparing to see the patient, getting/reviewing separately obtained history, performing a medically appropriate exam/evaluation, counseling and educating, placing orders, and documenting clinical information in the EHR.

## 2024-06-30 NOTE — Telephone Encounter (Signed)
 FYI Only or Action Required?: FYI only for provider: appointment scheduled on 06/30/24.  Patient was last seen in primary care on 05/27/2024 by Merna Huxley, NP.  Called Nurse Triage reporting Urinary Tract Infection.  Symptoms began several days ago.  Interventions attempted: Rest, hydration, or home remedies.  Symptoms are: unchanged.  Triage Disposition: See Physician Within 24 Hours  Patient/caregiver understands and will follow disposition?: Yes   Copied from CRM #[id number]. Topic: Clinical - Red Word Triage >> Jun 30, 2024  9:00 AM Rosina BIRCH wrote: Red Word that prompted transfer to Nurse Triage: patient is having symptoms of a UTI for a couple days now - some pelvic discomfort, frequent urination (no pain, but feeling of really having to  go).   The patient stated he would like a call back after I checked back with him from being on hold Reason for Disposition  All other males with painful urination  Answer Assessment - Initial Assessment Questions 1. SEVERITY: How bad is the pain?  (e.g., Scale 1-10; mild, moderate, or severe)     Pressure  2. FREQUENCY: How many times have you had painful urination today?      Increased  3. PATTERN: Is pain present every time you urinate or just sometimes?      No pain  4. ONSET: When did the painful urination start?      Couple days  5. FEVER: Do you have a fever? If Yes, ask: What is your temperature, how was it measured, and when did it start?     Denies  6. PAST UTI: Have you had a urine infection before? If Yes, ask: When was the last time? and What happened that time?      yes 7. CAUSE: What do you think is causing the painful urination?      uti 8. OTHER SYMPTOMS: Do you have any other symptoms? (e.g., flank pain, penis discharge, scrotal pain, blood in urine)     denies  Protocols used: Urination Pain - Male-A-AH

## 2024-07-24 ENCOUNTER — Ambulatory Visit: Payer: Self-pay | Admitting: Adult Health

## 2024-07-24 ENCOUNTER — Ambulatory Visit: Admitting: Adult Health

## 2024-07-24 VITALS — BP 128/82 | HR 70 | Temp 98.4°F | Wt 175.2 lb

## 2024-07-24 DIAGNOSIS — R351 Nocturia: Secondary | ICD-10-CM | POA: Diagnosis not present

## 2024-07-24 DIAGNOSIS — E782 Mixed hyperlipidemia: Secondary | ICD-10-CM

## 2024-07-24 DIAGNOSIS — Z85828 Personal history of other malignant neoplasm of skin: Secondary | ICD-10-CM | POA: Diagnosis not present

## 2024-07-24 DIAGNOSIS — Z0000 Encounter for general adult medical examination without abnormal findings: Secondary | ICD-10-CM | POA: Diagnosis not present

## 2024-07-24 DIAGNOSIS — N401 Enlarged prostate with lower urinary tract symptoms: Secondary | ICD-10-CM | POA: Diagnosis not present

## 2024-07-24 LAB — LIPID PANEL
Cholesterol: 188 mg/dL (ref 0–200)
HDL: 66.1 mg/dL (ref >39.00)
LDL Cholesterol: 110 mg/dL — ABNORMAL HIGH (ref 0–99)
NonHDL: 122.28
Total CHOL/HDL Ratio: 3
Triglycerides: 63 mg/dL (ref 0.0–149.0)
VLDL: 12.6 mg/dL (ref 0.0–40.0)

## 2024-07-24 LAB — CBC
HCT: 43 % (ref 39.0–52.0)
Hemoglobin: 14.5 g/dL (ref 13.0–17.0)
MCHC: 33.6 g/dL (ref 30.0–36.0)
MCV: 92.5 fl (ref 78.0–100.0)
Platelets: 185 K/uL (ref 150.0–400.0)
RBC: 4.65 Mil/uL (ref 4.22–5.81)
RDW: 13.2 % (ref 11.5–15.5)
WBC: 5.1 K/uL (ref 4.0–10.5)

## 2024-07-24 LAB — COMPREHENSIVE METABOLIC PANEL WITH GFR
ALT: 12 U/L (ref 0–53)
AST: 19 U/L (ref 0–37)
Albumin: 4.3 g/dL (ref 3.5–5.2)
Alkaline Phosphatase: 46 U/L (ref 39–117)
BUN: 17 mg/dL (ref 6–23)
CO2: 31 meq/L (ref 19–32)
Calcium: 9.5 mg/dL (ref 8.4–10.5)
Chloride: 101 meq/L (ref 96–112)
Creatinine, Ser: 1.09 mg/dL (ref 0.40–1.50)
GFR: 67.46 mL/min (ref >60.00)
Glucose, Bld: 84 mg/dL (ref 70–99)
Potassium: 4.9 meq/L (ref 3.5–5.1)
Sodium: 139 meq/L (ref 135–145)
Total Bilirubin: 0.7 mg/dL (ref 0.2–1.2)
Total Protein: 6.5 g/dL (ref 6.0–8.3)

## 2024-07-24 LAB — PSA: PSA: 2.44 ng/mL (ref 0.10–4.00)

## 2024-07-24 LAB — TSH: TSH: 1.51 u[IU]/mL (ref 0.35–5.50)

## 2024-07-24 NOTE — Progress Notes (Signed)
 Subjective:    Patient ID: Kenneth York, male    DOB: Jul 21, 1951, 73 y.o.   MRN: [id number]  HPI Patient presents for yearly preventative medicine examination. He is a pleasant 73 year old male who  has a past medical history of Allergy, Cancer (HCC), and Scoliosis of thoracic spine.  H/o BCC - followed by Dermatology on a yearly basis.   Hyperlipidemia - mildly elevated LDL in the past. He is not currently on medication  Lab Results  Component Value Date   CHOL 180 05/23/2021   HDL 68.40 05/23/2021   LDLCALC 100 (H) 05/23/2021   TRIG 58.0 05/23/2021   CHOLHDL 3 05/23/2021   BPH with lower urinary tract symptoms - has nocturia x 2-3. Has not wanted to start Flomax    All immunizations and health maintenance protocols were reviewed with the patient and needed orders were placed.  Appropriate screening laboratory values were ordered for the patient including screening of hyperlipidemia, renal function and hepatic function. If indicated by BPH, a PSA was ordered.  Medication reconciliation,  past medical history, social history, problem list and allergies were reviewed in detail with the patient  Goals were established with regard to weight loss, exercise, and  diet in compliance with medications. He does stay active and eat healthy.  Wt Readings from Last 3 Encounters:  07/24/24 175 lb 3.2 oz (79.5 kg)  06/30/24 174 lb 9.6 oz (79.2 kg)  05/27/24 173 lb (78.5 kg)     He is up to date on routine colon cancer screening   Review of Systems  Constitutional: Negative.   HENT: Negative.    Eyes: Negative.   Respiratory: Negative.    Cardiovascular: Negative.   Gastrointestinal: Negative.   Endocrine: Negative.   Genitourinary: Negative.   Musculoskeletal:  Positive for arthralgias and back pain.  Skin: Negative.   Allergic/Immunologic: Negative.   Neurological: Negative.   Hematological: Negative.   Psychiatric/Behavioral: Negative.    All other systems reviewed and  are negative.  Past Medical History:  Diagnosis Date   Allergy    Cancer (HCC)    basal cell    Scoliosis of thoracic spine     Social History   Socioeconomic History   Marital status: Married    Spouse name: Not on file   Number of children: Not on file   Years of education: Not on file   Highest education level: Bachelor's degree (e.g., BA, AB, BS)  Occupational History   Not on file  Tobacco Use   Smoking status: Never   Smokeless tobacco: Never  Substance and Sexual Activity   Alcohol use: Yes    Alcohol/week: 4.0 standard drinks of alcohol    Types: 4 Cans of beer per week   Drug use: No   Sexual activity: Not on file  Other Topics Concern   Not on file  Social History Narrative   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    Married    No children    Social Drivers of Health   Tobacco Use: Low Risk (06/30/2024)   Patient History    Smoking Tobacco Use: Never    Smokeless Tobacco Use: Never    Passive Exposure: Not on file  Financial Resource Strain: Low Risk (05/26/2024)   Overall Financial Resource Strain (CARDIA)    Difficulty of Paying Living Expenses: Not hard at all  Food Insecurity: No Food Insecurity (05/26/2024)   Epic  Worried About Programme Researcher, Broadcasting/film/video in the Last Year: Never true    Ran Out of Food in the Last Year: Never true  Transportation Needs: No Transportation Needs (05/26/2024)   Epic    Lack of Transportation (Medical): No    Lack of Transportation (Non-Medical): No  Physical Activity: Sufficiently Active (05/26/2024)   Exercise Vital Sign    Days of Exercise per Week: 7 days    Minutes of Exercise per Session: 60 min  Stress: No Stress Concern Present (05/26/2024)   [employer] of Occupational Health - Occupational Stress Questionnaire    Feeling of Stress: Not at all  Social Connections: Socially Integrated (05/26/2024)   Social Connection and Isolation Panel    Frequency of  Communication with Friends and Family: More than three times a week    Frequency of Social Gatherings with Friends and Family: Twice a week    Attends Religious Services: More than 4 times per year    Active Member of [employer] or Organizations: Yes    Attends Banker Meetings: More than 4 times per year    Marital Status: Married  Catering Manager Violence: Not At Risk (04/16/2024)   Epic    Fear of Current or Ex-Partner: No    Emotionally Abused: No    Physically Abused: No    Sexually Abused: No  Depression (PHQ2-9): Low Risk (07/24/2024)   Depression (PHQ2-9)    PHQ-2 Score: 0  Alcohol Screen: Low Risk (05/26/2024)   Alcohol Screen    Last Alcohol Screening Score (AUDIT): 2  Housing: Low Risk (05/26/2024)   Epic    Unable to Pay for Housing in the Last Year: No    Number of Times Moved in the Last Year: 0    Homeless in the Last Year: No  Utilities: Not At Risk (04/16/2024)   Epic    Threatened with loss of utilities: No  Health Literacy: Adequate Health Literacy (04/16/2024)   B1300 Health Literacy    Frequency of need for help with medical instructions: Never    Past Surgical History:  Procedure Laterality Date   MOHS SURGERY  2004    Family History  Problem Relation Age of Onset   Ovarian cancer Mother    Heart disease Father    Prostate cancer Father    Colon cancer Neg Hx    Esophageal cancer Neg Hx    Rectal cancer Neg Hx    Stomach cancer Neg Hx     Allergies[1]  Medications Ordered Prior to Encounter[2]  BP 128/82   Pulse 70   Temp 98.4 F (36.9 C)   Wt 175 lb 3.2 oz (79.5 kg)   SpO2 97%   BMI 24.44 kg/m       Objective:   Physical Exam Vitals and nursing note reviewed.  Constitutional:      General: He is not in acute distress.    Appearance: Normal appearance. He is not ill-appearing.  HENT:     Head: Normocephalic and atraumatic.     Right Ear: Tympanic membrane, ear canal and external ear normal. There is no impacted cerumen.      Left Ear: Tympanic membrane, ear canal and external ear normal. There is no impacted cerumen.     Nose: Nose normal. No congestion or rhinorrhea.     Mouth/Throat:     Mouth: Mucous membranes are moist.     Pharynx: Oropharynx is clear.  Eyes:     Extraocular Movements: Extraocular movements intact.  Conjunctiva/sclera: Conjunctivae normal.     Pupils: Pupils are equal, round, and reactive to light.  Neck:     Vascular: No carotid bruit.  Cardiovascular:     Rate and Rhythm: Normal rate and regular rhythm.     Pulses: Normal pulses.     Heart sounds: No murmur heard.    No friction rub. No gallop.  Pulmonary:     Effort: Pulmonary effort is normal.     Breath sounds: Normal breath sounds.  Abdominal:     General: Abdomen is flat. Bowel sounds are normal. There is no distension.     Palpations: Abdomen is soft. There is no mass.     Tenderness: There is no abdominal tenderness. There is no guarding or rebound.     Hernia: No hernia is present.  Musculoskeletal:        General: Normal range of motion.     Cervical back: Normal range of motion and neck supple.  Lymphadenopathy:     Cervical: No cervical adenopathy.  Skin:    General: Skin is warm and dry.     Capillary Refill: Capillary refill takes less than 2 seconds.  Neurological:     General: No focal deficit present.     Mental Status: He is alert and oriented to person, place, and time.  Psychiatric:        Mood and Affect: Mood normal.        Behavior: Behavior normal.        Thought Content: Thought content normal.        Judgment: Judgment normal.        Assessment & Plan:  1. Routine general medical examination at a health care facility (Primary) Today patient counseled on age appropriate routine health concerns for screening and prevention, each reviewed and up to date or declined. Immunizations reviewed and up to date or declined. Labs ordered and reviewed. Risk factors for depression reviewed and  negative. Hearing function and visual acuity are intact. ADLs screened and addressed as needed. Functional ability and level of safety reviewed and appropriate. Education, counseling and referrals performed based on assessed risks today. Patient provided with a copy of personalized plan for preventive services. - Follow up in one year or sooner if needed  2. History of basal cell carcinoma (BCC) - Follow up with Dermatrology as directed  3. Moderate mixed hyperlipidemia not requiring statin therapy - Consider statin  - Lipid panel; Future - TSH; Future - CBC; Future - Comprehensive metabolic panel with GFR; Future  4. BPH associated with nocturia  - PSA; Future  Darleene Shape, NP     [1] No Known Allergies [2]  Current Outpatient Medications on File Prior to Visit  Medication Sig Dispense Refill   Ascorbic Acid (VITAMIN C) 100 MG tablet Take 100 mg by mouth daily.     cetirizine (ZYRTEC) 10 MG tablet      No current facility-administered medications on file prior to visit.

## 2024-07-24 NOTE — Patient Instructions (Signed)
 It was great seeing you today   We will follow up with you regarding your lab work   Please let me know if you need anything

## 2024-07-27 ENCOUNTER — Telehealth: Payer: Self-pay | Admitting: *Deleted

## 2024-07-27 NOTE — Telephone Encounter (Signed)
 Copied from CRM #[id number]. Topic: Clinical - Lab/Test Results >> Jul 24, 2024  5:01 PM Ashley R wrote: Reason for CRM: Read results note verbatim. Patient acknowledge message and states he is OK with both CT scan and starting cholesterol medication. MyChart Message or callback OK to discuss further.

## 2024-07-28 NOTE — Telephone Encounter (Signed)
 Noted

## 2024-07-29 ENCOUNTER — Encounter: Payer: Self-pay | Admitting: Adult Health

## 2024-07-29 ENCOUNTER — Telehealth: Payer: Self-pay | Admitting: Adult Health

## 2024-07-29 ENCOUNTER — Other Ambulatory Visit: Payer: Self-pay | Admitting: Adult Health

## 2024-07-29 DIAGNOSIS — E782 Mixed hyperlipidemia: Secondary | ICD-10-CM

## 2024-07-29 MED ORDER — ATORVASTATIN CALCIUM 10 MG PO TABS: 10.0000 mg | ORAL_TABLET | Freq: Every day | ORAL | 3 refills | Status: DC

## 2024-07-29 NOTE — Telephone Encounter (Signed)
 [facility]

## 2024-07-29 NOTE — Telephone Encounter (Signed)
 Noted

## 2024-07-29 NOTE — Telephone Encounter (Signed)
 Patient informed of his lab results. Patient is agreeing to the CT and is okay with paying $100.00.

## 2024-08-25 ENCOUNTER — Ambulatory Visit (HOSPITAL_BASED_OUTPATIENT_CLINIC_OR_DEPARTMENT_OTHER)
Admission: RE | Admit: 2024-08-25 | Discharge: 2024-08-25 | Disposition: A | Discharge status: Home / Self Care | Payer: Self-pay | Source: Ambulatory Visit | Attending: Adult Health | Admitting: Adult Health

## 2024-08-25 ENCOUNTER — Other Ambulatory Visit: Payer: Self-pay | Admitting: Adult Health

## 2024-08-25 ENCOUNTER — Ambulatory Visit: Payer: Self-pay | Admitting: Adult Health

## 2024-08-25 DIAGNOSIS — I2510 Atherosclerotic heart disease of native coronary artery without angina pectoris: Secondary | ICD-10-CM

## 2024-08-25 DIAGNOSIS — E782 Mixed hyperlipidemia: Secondary | ICD-10-CM | POA: Insufficient documentation

## 2024-08-27 MED ORDER — ROSUVASTATIN CALCIUM 20 MG PO TABS: 20.0000 mg | ORAL_TABLET | Freq: Every day | ORAL | 0 refills | Status: DC

## 2024-08-27 NOTE — Addendum Note (Signed)
 Addended by: VICCI LEADER R on: 08/27/2024 04:36 PM   Modules accepted: Orders

## 2024-09-01 NOTE — Progress Notes (Unsigned)
 "     Cardiology Office Note Date:  09/03/2024  ID:  SHERMAR FRIEDLAND, DOB 05-02-1951, MRN [id number] PCP:  Merna Huxley, NP  Cardiologist:  Joelle VEAR Ren Donley, MD  Chief Complaint  Patient presents with   Coronary Artery Disease     Problems Elevated CAC 624 (72nd) Dilated ascending aorta 4.0 cm M: N; LDL 110 12/25  Visits  1/26: RN20, NST, TTE, ASA81, Lpa    Discussed the use of AI scribe software for clinical note transcription with the patient, who gave verbal consent to proceed.  History of Present Illness Kenneth York is a 74 year old male who presents for evaluation following a CT cardiac calcium  test. He underwent a CT cardiac calcium  test approximately a week ago, and was told the result showed an aortic dilation. His calcium  score was reported to be over 600, which he does not fully understand. He has no prior history of heart issues, although he was informed of a first-degree AV block during childhood. He has no history of high cholesterol, with levels previously being borderline normal, ranging from 100 to 110 over the past five years. No history of high blood pressure or smoking. He maintains an active lifestyle, walking 45 minutes to an hour almost daily and attending the gym five to six days a week. His weight has remained stable for the past 30 years. He tries to eat healthily. His father had a heart attack and underwent a triple bypass surgery discovered during a stress test in his mid-forties, although he lived to 40 without dying from heart issues. He has a history of arthritis in his back and underwent back surgery three years ago. He used to take baby aspirin  twice a week without issues. No chest pain or shortness of breath during physical activities.   ROS: Please see the history of present illness. All other systems are reviewed and negative.   Past Medical History:  Diagnosis Date   Allergy    Cancer (HCC)    basal cell    Scoliosis of  thoracic spine     Past Surgical History:  Procedure Laterality Date   MOHS SURGERY  2004    Current Outpatient Medications  Medication Sig Dispense Refill   cetirizine (ZYRTEC) 10 MG tablet      Multiple Vitamins-Minerals (MENS 50+ MULTI VITAMIN/MIN PO)      Ascorbic Acid (VITAMIN C) 100 MG tablet Take 100 mg by mouth daily.     No current facility-administered medications for this visit.    Allergies:   Patient has no known allergies.   Social History:  see above  Family History:  see above  PHYSICAL EXAM: VS:  BP 138/84   Pulse 68   Ht 5' 11 (1.803 m)   Wt 176 lb (79.8 kg)   SpO2 97%   BMI 24.55 kg/m  , BMI Body mass index is 24.55 kg/m. GEN: Well nourished, well developed, in no acute distress HEENT: normal Neck: no JVD, carotid bruits, or masses Cardiac: RRR; no murmurs, rubs, or gallops,no edema  Respiratory:  CTAB bilaterally, normal work of breathing GI: soft, nontender, nondistended, + BS Extremities: No LE edema Skin: warm and dry, no rash Neuro:  Strength and sensation are intact  EKG: NSR  Recent Labs: Reviewed  Studies: Reviewed  Assessment and Plan Assessment & Plan Atherosclerotic heart disease of native coronary artery High calcium  score indicates significant coronary artery disease risk. Likely genetic due to family history. Statins recommended  for inflammation reduction. Lipoprotein A testing considered for genetic risk assessment. - Ordered echocardiogram to assess cardiac function and establish baseline. - Scheduled stress test. - Initiated low-dose statin therapy with potential to increase dosage based on tolerance. - Ordered lipoprotein A test to assess genetic risk for coronary artery disease. - Initiated daily low-dose aspirin  therapy.  Thoracic aortic ectasia Dilated aorta, not a true aneurysm. Typically stable, low likelihood of requiring surgical intervention. Ultrasound preferred for monitoring to avoid radiation. - Ordered  echocardiogram to monitor aortic size and cardiac function. - Plan for annual ultrasound monitoring of aortic size.  Hyperlipidemia Cholesterol levels slightly elevated. Statin therapy recommended for anti-inflammatory effects. Discussed potential side effects and agreed to start with a low dose. - Initiated low-dose statin therapy with potential to increase dosage based on tolerance.   Signed, Joelle VEAR Ren Donley, MD  09/03/2024 11:37 AM    [facility] "

## 2024-09-03 ENCOUNTER — Telehealth: Payer: Self-pay

## 2024-09-03 ENCOUNTER — Ambulatory Visit: Attending: Cardiology

## 2024-09-03 VITALS — BP 138/84 | HR 68 | Ht 71.0 in | Wt 176.0 lb

## 2024-09-03 DIAGNOSIS — E785 Hyperlipidemia, unspecified: Secondary | ICD-10-CM

## 2024-09-03 DIAGNOSIS — I2510 Atherosclerotic heart disease of native coronary artery without angina pectoris: Secondary | ICD-10-CM | POA: Diagnosis not present

## 2024-09-03 DIAGNOSIS — I77810 Thoracic aortic ectasia: Secondary | ICD-10-CM | POA: Diagnosis not present

## 2024-09-03 MED ORDER — ASPIRIN 81 MG PO TBEC: 81.0000 mg | DELAYED_RELEASE_TABLET | Freq: Every day | ORAL | 3 refills | Status: AC

## 2024-09-03 MED ORDER — ROSUVASTATIN CALCIUM 10 MG PO TABS: 10.0000 mg | ORAL_TABLET | Freq: Every day | ORAL | 3 refills | Status: AC

## 2024-09-03 NOTE — Telephone Encounter (Signed)
 Informed pt over the phone that the lab Dr Ren ordered was an Lpa which was not done in December. Explained that the Lpa would show if he had an genetic factors and could better guide the Dr in treating his cholesterol levels. Pt verbalizes understanding, states he will get it done tomorrow more than likely.

## 2024-09-03 NOTE — Telephone Encounter (Signed)
 Patient is calling back to see if lab work results from 07/2024 can be used for requesting labs. Please advise.

## 2024-09-03 NOTE — Patient Instructions (Signed)
 Medication Instructions:  Start Aspirin  81 mg take one tablet daily  Start Crestor  10 mg take one tablet daily *If you need a refill on your cardiac medications before your next appointment, please call your pharmacy*  Lab Work: Today- LPa If you have labs (blood work) drawn today and your tests are completely normal, you will receive your results only by: MyChart Message (if you have MyChart) OR A paper copy in the mail If you have any lab test that is abnormal or we need to change your treatment, we will call you to review the results.  Testing/Procedures:   You are scheduled for a Myocardial Perfusion Study. Please arrive 15 minutes prior to your appointment time for registration and insurance purposes.  The test will take approximately 3-4 hours to complete; you may bring reading material.  If someone comes with you to your appointment, they will need to remain in the main lobby due to limited space in the testing area.  If you are pregnant or breastfeeding, please notify the nuclear lab prior to your appointment.  HOW TO PREPARE FOR YOUR TEST:  DO NOT eat or drink 3 hours prior to your test, except you may have water DO NOT consume products containing caffeine (regular or decaffeinated) 12 hours prior to your test (ex: coffee, chocolate, sodas, tea.) DO bring a list of your current medications with you.  If not listed below, you may take your medications as normal. DO NOT TAKE _________________ for 24 HOURS prior to the test.  Bring the medication to your appointment as you may be required to take it once the test is complete. DO wear comfortable clothes (no dresses or overalls) and walking shoes, tennis shoes preferred (No heels or open toe shoes are allowed) DO NOT wear cologne, perfume, aftershave, or lotions (deodorant is allowed). If these instructions are not followed, your test will have to be rescheduled.   Please report to [address] [place], [place] [address].  If  you have questions or concerns about your appointment, you can call the Nuclear Lab [phone].  If you cannot keep your appointment, please provide 24 hours notification to the Nuclear Lab, to avoid a possible $50 charge to your account.     Your physician has requested that you have an echocardiogram. Echocardiography is a painless test that uses sound waves to create images of your heart. It provides your doctor with information about the size and shape of your heart and how well your hearts chambers and valves are working. This procedure takes approximately one hour. There are no restrictions for this procedure. Please do NOT wear cologne, perfume, aftershave, or lotions (deodorant is allowed). Please arrive 15 minutes prior to your appointment time.  Please note: We ask at that you not bring children with you during ultrasound (echo/ vascular) testing. Due to room size and safety concerns, children are not allowed in the ultrasound rooms during exams. Our front office staff cannot provide observation of children in our lobby area while testing is being conducted. An adult accompanying a patient to their appointment will only be allowed in the ultrasound room at the discretion of the ultrasound technician under special circumstances. We apologize for any inconvenience.   Follow-Up: At [facility], you and your health needs are our priority.  As part of our continuing mission to provide you with exceptional heart care, our providers are all part of one team.  This team includes your primary Cardiologist (physician) and Advanced Practice Providers or  APPs (Physician Assistants and Nurse Practitioners) who all work together to provide you with the care you need, when you need it.  Your next appointment:   2 month(s)  Provider:   Joelle VEAR Ren Donley, MD

## 2024-09-08 LAB — LIPOPROTEIN A (LPA): Lipoprotein (a): <8.4 nmol/L

## 2024-09-09 ENCOUNTER — Ambulatory Visit: Payer: Self-pay

## 2024-09-10 ENCOUNTER — Telehealth (HOSPITAL_COMMUNITY): Payer: Self-pay

## 2024-09-10 NOTE — Telephone Encounter (Signed)
 Spoke with the patient, detailed instructions given. Kenneth York CCT

## 2024-09-15 ENCOUNTER — Other Ambulatory Visit: Payer: Self-pay

## 2024-09-15 DIAGNOSIS — I77810 Thoracic aortic ectasia: Secondary | ICD-10-CM

## 2024-09-15 DIAGNOSIS — I2510 Atherosclerotic heart disease of native coronary artery without angina pectoris: Secondary | ICD-10-CM

## 2024-09-15 DIAGNOSIS — E785 Hyperlipidemia, unspecified: Secondary | ICD-10-CM

## 2024-09-16 ENCOUNTER — Ambulatory Visit (HOSPITAL_COMMUNITY)

## 2024-09-16 DIAGNOSIS — I2510 Atherosclerotic heart disease of native coronary artery without angina pectoris: Secondary | ICD-10-CM

## 2024-09-16 DIAGNOSIS — I77810 Thoracic aortic ectasia: Secondary | ICD-10-CM

## 2024-09-16 DIAGNOSIS — E785 Hyperlipidemia, unspecified: Secondary | ICD-10-CM | POA: Diagnosis not present

## 2024-09-16 LAB — MYOCARDIAL PERFUSION IMAGING
Angina Index: 0
Duke Treadmill Score: 10
Estimated workload: 11.7
Exercise duration (min): 10 min
Exercise duration (sec): 0 s
LV dias vol: 126 mL (ref 62–150)
LV sys vol: 41 mL
MPHR: 147 {beats}/min
Nuc Stress EF: 67 %
Peak HR: 146 {beats}/min
Percent HR: 99 %
Rest HR: 68 {beats}/min
Rest Nuclear Isotope Dose: 9.7 mCi
SDS: 3
SRS: 1
SSS: 4
ST Depression (mm): 0 mm
Stress Nuclear Isotope Dose: 31.3 mCi
TID: 0.88

## 2024-09-16 MED ORDER — TECHNETIUM TC 99M TETROFOSMIN IV KIT
9.7000 | PACK | Freq: Once | INTRAVENOUS | Status: AC | PRN
  Administered 2024-09-16: 9.7 via INTRAVENOUS

## 2024-09-16 MED ORDER — TECHNETIUM TC 99M TETROFOSMIN IV KIT
31.3000 | PACK | Freq: Once | INTRAVENOUS | Status: AC | PRN
  Administered 2024-09-16: 31.3 via INTRAVENOUS

## 2024-10-09 ENCOUNTER — Ambulatory Visit (HOSPITAL_COMMUNITY)

## 2024-11-03 ENCOUNTER — Ambulatory Visit
